# Patient Record
Sex: Male | Born: 1945 | Race: White | Hispanic: No | Marital: Married | Smoking: Former smoker
Health system: Southern US, Community
[De-identification: ages and names within clinical notes are randomized; demographics above are authoritative.]

## PROBLEM LIST (undated history)

## (undated) DIAGNOSIS — I639 Cerebral infarction, unspecified: Secondary | ICD-10-CM

## (undated) HISTORY — DX: Cerebral infarction, unspecified: I63.9

## (undated) HISTORY — PX: ROTATOR CUFF REPAIR: SHX139

## (undated) HISTORY — PX: HERNIA REPAIR: SHX51

---

## 2010-01-20 ENCOUNTER — Emergency Department (HOSPITAL_COMMUNITY): Admission: EM | Admit: 2010-01-20 | Discharge: 2010-01-20 | Payer: Self-pay | Admitting: Emergency Medicine

## 2010-01-25 ENCOUNTER — Encounter (INDEPENDENT_AMBULATORY_CARE_PROVIDER_SITE_OTHER): Payer: Self-pay | Admitting: Neurology

## 2010-01-25 ENCOUNTER — Ambulatory Visit (HOSPITAL_COMMUNITY): Admission: RE | Admit: 2010-01-25 | Discharge: 2010-01-25 | Payer: Self-pay | Admitting: Neurology

## 2010-01-29 ENCOUNTER — Ambulatory Visit (HOSPITAL_COMMUNITY): Admission: RE | Admit: 2010-01-29 | Discharge: 2010-01-29 | Payer: Self-pay | Admitting: Neurology

## 2011-02-06 LAB — LIPID PANEL
Cholesterol: 185 mg/dL (ref 0–200)
LDL Cholesterol: 142 mg/dL — ABNORMAL HIGH (ref 0–99)
Total CHOL/HDL Ratio: 6 RATIO
VLDL: 12 mg/dL (ref 0–40)

## 2011-02-06 LAB — CBC
Hemoglobin: 14.5 g/dL (ref 13.0–17.0)
Platelets: 181 10*3/uL (ref 150–400)

## 2011-02-06 LAB — DIFFERENTIAL
Basophils Absolute: 0 10*3/uL (ref 0.0–0.1)
Basophils Relative: 1 % (ref 0–1)
Eosinophils Absolute: 0 10*3/uL (ref 0.0–0.7)
Eosinophils Relative: 0 % (ref 0–5)
Lymphocytes Relative: 20 % (ref 12–46)
Monocytes Relative: 4 % (ref 3–12)
Neutro Abs: 4.9 10*3/uL (ref 1.7–7.7)

## 2011-02-06 LAB — COMPREHENSIVE METABOLIC PANEL
AST: 22 U/L (ref 0–37)
Alkaline Phosphatase: 33 U/L — ABNORMAL LOW (ref 39–117)
CO2: 25 mEq/L (ref 19–32)
Chloride: 106 mEq/L (ref 96–112)
Creatinine, Ser: 1.09 mg/dL (ref 0.4–1.5)
Potassium: 3.9 mEq/L (ref 3.5–5.1)
Sodium: 137 mEq/L (ref 135–145)
Total Bilirubin: 0.9 mg/dL (ref 0.3–1.2)

## 2014-05-04 ENCOUNTER — Inpatient Hospital Stay (HOSPITAL_COMMUNITY)
Admission: EM | Admit: 2014-05-04 | Discharge: 2014-05-06 | DRG: 069 | Disposition: A | Discharge status: Home / Self Care | Payer: BC Managed Care – PPO | Attending: Internal Medicine | Admitting: Internal Medicine

## 2014-05-04 ENCOUNTER — Encounter (HOSPITAL_COMMUNITY): Payer: Self-pay | Admitting: Emergency Medicine

## 2014-05-04 ENCOUNTER — Emergency Department (HOSPITAL_COMMUNITY): Payer: BC Managed Care – PPO

## 2014-05-04 DIAGNOSIS — R209 Unspecified disturbances of skin sensation: Secondary | ICD-10-CM | POA: Diagnosis present

## 2014-05-04 DIAGNOSIS — R51 Headache: Secondary | ICD-10-CM | POA: Diagnosis present

## 2014-05-04 DIAGNOSIS — G459 Transient cerebral ischemic attack, unspecified: Principal | ICD-10-CM | POA: Diagnosis present

## 2014-05-04 DIAGNOSIS — Z7982 Long term (current) use of aspirin: Secondary | ICD-10-CM

## 2014-05-04 DIAGNOSIS — Z823 Family history of stroke: Secondary | ICD-10-CM

## 2014-05-04 DIAGNOSIS — I6359 Cerebral infarction due to unspecified occlusion or stenosis of other cerebral artery: Secondary | ICD-10-CM

## 2014-05-04 DIAGNOSIS — Z8249 Family history of ischemic heart disease and other diseases of the circulatory system: Secondary | ICD-10-CM

## 2014-05-04 DIAGNOSIS — I639 Cerebral infarction, unspecified: Secondary | ICD-10-CM | POA: Diagnosis present

## 2014-05-04 DIAGNOSIS — Z833 Family history of diabetes mellitus: Secondary | ICD-10-CM

## 2014-05-04 DIAGNOSIS — Z87891 Personal history of nicotine dependence: Secondary | ICD-10-CM | POA: Diagnosis not present

## 2014-05-04 LAB — I-STAT CHEM 8, ED
BUN: 13 mg/dL (ref 6–23)
CHLORIDE: 103 meq/L (ref 96–112)
Calcium, Ion: 1.11 mmol/L — ABNORMAL LOW (ref 1.13–1.30)
Creatinine, Ser: 1 mg/dL (ref 0.50–1.35)
GLUCOSE: 114 mg/dL — AB (ref 70–99)
HEMATOCRIT: 46 % (ref 39.0–52.0)
HEMOGLOBIN: 15.6 g/dL (ref 13.0–17.0)
POTASSIUM: 3.7 meq/L (ref 3.7–5.3)
Sodium: 142 mEq/L (ref 137–147)
TCO2: 24 mmol/L (ref 0–100)

## 2014-05-04 LAB — URINALYSIS, ROUTINE W REFLEX MICROSCOPIC
Bilirubin Urine: NEGATIVE
Glucose, UA: NEGATIVE mg/dL
Hgb urine dipstick: NEGATIVE
Ketones, ur: NEGATIVE mg/dL
Leukocytes, UA: NEGATIVE
NITRITE: NEGATIVE
Protein, ur: NEGATIVE mg/dL
SPECIFIC GRAVITY, URINE: 1.006 (ref 1.005–1.030)
Urobilinogen, UA: 0.2 mg/dL (ref 0.0–1.0)
pH: 7.5 (ref 5.0–8.0)

## 2014-05-04 LAB — PROTIME-INR
INR: 0.98 (ref 0.00–1.49)
Prothrombin Time: 12.8 seconds (ref 11.6–15.2)

## 2014-05-04 LAB — RAPID URINE DRUG SCREEN, HOSP PERFORMED
Amphetamines: NOT DETECTED
BARBITURATES: NOT DETECTED
Benzodiazepines: NOT DETECTED
Cocaine: NOT DETECTED
Opiates: NOT DETECTED
TETRAHYDROCANNABINOL: NOT DETECTED

## 2014-05-04 LAB — COMPREHENSIVE METABOLIC PANEL
ALBUMIN: 3.8 g/dL (ref 3.5–5.2)
ALT: 14 U/L (ref 0–53)
AST: 21 U/L (ref 0–37)
Alkaline Phosphatase: 37 U/L — ABNORMAL LOW (ref 39–117)
BUN: 13 mg/dL (ref 6–23)
CO2: 24 mEq/L (ref 19–32)
Calcium: 8.7 mg/dL (ref 8.4–10.5)
Chloride: 104 mEq/L (ref 96–112)
Creatinine, Ser: 1 mg/dL (ref 0.50–1.35)
GFR calc Af Amer: 87 mL/min — ABNORMAL LOW (ref >90)
GFR calc non Af Amer: 75 mL/min — ABNORMAL LOW (ref >90)
Glucose, Bld: 115 mg/dL — ABNORMAL HIGH (ref 70–99)
POTASSIUM: 3.9 meq/L (ref 3.7–5.3)
Sodium: 141 mEq/L (ref 137–147)
Total Bilirubin: 0.6 mg/dL (ref 0.3–1.2)
Total Protein: 6.9 g/dL (ref 6.0–8.3)

## 2014-05-04 LAB — DIFFERENTIAL
BASOS PCT: 1 % (ref 0–1)
Basophils Absolute: 0 10*3/uL (ref 0.0–0.1)
Eosinophils Absolute: 0 10*3/uL (ref 0.0–0.7)
Eosinophils Relative: 0 % (ref 0–5)
LYMPHS PCT: 21 % (ref 12–46)
Lymphs Abs: 1.1 10*3/uL (ref 0.7–4.0)
MONOS PCT: 6 % (ref 3–12)
Monocytes Absolute: 0.3 10*3/uL (ref 0.1–1.0)
NEUTROS ABS: 3.8 10*3/uL (ref 1.7–7.7)
Neutrophils Relative %: 72 % (ref 43–77)

## 2014-05-04 LAB — CBC
HCT: 43.2 % (ref 39.0–52.0)
HEMOGLOBIN: 14.3 g/dL (ref 13.0–17.0)
MCH: 30.9 pg (ref 26.0–34.0)
MCHC: 33.1 g/dL (ref 30.0–36.0)
MCV: 93.3 fL (ref 78.0–100.0)
Platelets: 177 10*3/uL (ref 150–400)
RBC: 4.63 MIL/uL (ref 4.22–5.81)
RDW: 13.3 % (ref 11.5–15.5)
WBC: 5.3 10*3/uL (ref 4.0–10.5)

## 2014-05-04 LAB — I-STAT TROPONIN, ED: TROPONIN I, POC: 0.01 ng/mL (ref 0.00–0.08)

## 2014-05-04 LAB — ETHANOL: Alcohol, Ethyl (B): <11 mg/dL (ref 0–11)

## 2014-05-04 LAB — APTT: APTT: 30 s (ref 24–37)

## 2014-05-04 MED ORDER — HEPARIN SODIUM (PORCINE) 5000 UNIT/ML IJ SOLN
5000.0000 [IU] | Freq: Three times a day (TID) | INTRAMUSCULAR | Status: DC
  Administered 2014-05-05 – 2014-05-06 (×5): 5000 [IU] via SUBCUTANEOUS
  Filled 2014-05-04 (×4): qty 1

## 2014-05-04 MED ORDER — ASPIRIN 325 MG PO TABS
325.0000 mg | ORAL_TABLET | Freq: Every day | ORAL | Status: DC
  Administered 2014-05-05: 325 mg via ORAL
  Filled 2014-05-04: qty 1

## 2014-05-04 NOTE — ED Notes (Signed)
Pt was given coffee(decaf) and a white bag lunch

## 2014-05-04 NOTE — ED Notes (Signed)
Per EMS: Pt at work when he experienced sudden right sided facial numbness/tingling and expressive aphasia. Coworkers state he didn't have facial droop or slurred speech. Episode lasted appx 10 minutes. Pt now denies any complaints. Neuro intact. Reports similar episode three years ago. 137/84. 76 NSR. 18 RR. 98% RA. CBG 156.

## 2014-05-04 NOTE — H&P (Signed)
[facility] History and Physical  Daniel Harper [id number] DOB: 08/19/1946 DOA: 05/04/2014  Referring physician: EDP PCP: Daniel Harper,Iain, MD   Chief Complaint: R sided numbness / weakness   HPI: Daniel EhrichRobert Harper is a 68 y.o. male who was talking on telephone this morning at work when he developed sudden onset of headache, R sided head and face paresthesia, difficulty with speech, difficulty with swallowing, RUE and RLE numbness and difficulty walking.  Symptoms lasted 25 mins, while completing the call and helping the client.  He then walked to his supervisor to report the problem who then promptly called EMS and patient was transported here.  He does report the call was stressful as client was agitated but patient did not feel stressed.  He was evaluated for TIA 4 years ago after an episode of double vision.  Thankfully by the time of evaluation in the ED he reports feeling 100% better and has no symptoms at present time.  Review of Systems: Systems reviewed.  As above, otherwise negative  History reviewed. No pertinent past medical history. Past Surgical History  Procedure Laterality Date  . Hernia repair     Social History:  reports that he quit smoking about 47 years ago. His smoking use included Cigarettes. He smoked 0.00 packs per day. He does not have any smokeless tobacco history on file. He reports that he does not drink alcohol or use illicit drugs.  No Known Allergies  Family History  Problem Relation Age of Onset  . Stroke Mother   . Hypertension Mother   . Diabetes Mother   . Cancer Mother   . Stroke Father   . Hypertension Father   . Diabetes Father      Prior to Admission medications   Medication Sig Start Date End Date Taking? Authorizing Provider  aspirin EC 81 MG tablet Take 81 mg by mouth daily.   Yes Historical Provider, MD  ibuprofen (ADVIL,MOTRIN) 200 MG tablet Take 200-800 mg by mouth every 6 (six) hours as needed for fever, headache, mild  pain, moderate pain or cramping.   Yes Historical Provider, MD  Multiple Vitamin (MULTIVITAMIN) tablet Take 1 tablet by mouth daily.   Yes Historical Provider, MD   Physical Exam: Filed Vitals:   05/04/14 2130  BP: 127/71  Pulse: 67  Temp:   Resp: 18    BP 127/71  Pulse 67  Temp(Src) 97.6 F (36.4 C)  Resp 18  SpO2 98%  General Appearance:    Alert, oriented, no distress, appears stated age  Head:    Normocephalic, atraumatic  Eyes:    PERRL, EOMI, sclera non-icteric        Nose:   Nares without drainage or epistaxis. Mucosa, turbinates normal  Throat:   Moist mucous membranes. Oropharynx without erythema or exudate.  Neck:   Supple. No carotid bruits.  No thyromegaly.  No lymphadenopathy.   Back:     No CVA tenderness, no spinal tenderness  Lungs:     Clear to auscultation bilaterally, without wheezes, rhonchi or rales  Chest wall:    No tenderness to palpitation  Heart:    Regular rate and rhythm without murmurs, gallops, rubs  Abdomen:     Soft, non-tender, nondistended, normal bowel sounds, no organomegaly  Genitalia:    deferred  Rectal:    deferred  Extremities:   No clubbing, cyanosis or edema.  Pulses:   2+ and symmetric all extremities  Skin:   Skin color, texture, turgor normal, no rashes or lesions  Lymph nodes:   Cervical, supraclavicular, and axillary nodes normal  Neurologic:   CNII-XII intact. Normal strength, sensation and reflexes      throughout    Labs on Admission:  Basic Metabolic Panel:  Recent Labs Lab 05/04/14 1615 05/04/14 1650  NA 141 142  K 3.9 3.7  CL 104 103  CO2 24  --   GLUCOSE 115* 114*  BUN 13 13  CREATININE 1.00 1.00  CALCIUM 8.7  --    Liver Function Tests:  Recent Labs Lab 05/04/14 1615  AST 21  ALT 14  ALKPHOS 37*  BILITOT 0.6  PROT 6.9  ALBUMIN 3.8   No results found for this basename: LIPASE, AMYLASE,  in the last 168 hours No results found for this basename: AMMONIA,  in the last 168 hours CBC:  Recent  Labs Lab 05/04/14 1615 05/04/14 1650  WBC 5.3  --   NEUTROABS 3.8  --   HGB 14.3 15.6  HCT 43.2 46.0  MCV 93.3  --   PLT 177  --    Cardiac Enzymes: No results found for this basename: CKTOTAL, CKMB, CKMBINDEX, TROPONINI,  in the last 168 hours  BNP (last 3 results) No results found for this basename: PROBNP,  in the last 8760 hours CBG: No results found for this basename: GLUCAP,  in the last 168 hours  Radiological Exams on Admission: Ct Head Wo Contrast  05/04/2014   CLINICAL DATA:  Numbness.  Stroke symptoms.  EXAM: CT HEAD WITHOUT CONTRAST  TECHNIQUE: Contiguous axial images were obtained from the base of the skull through the vertex without intravenous contrast.  COMPARISON:  01/29/2010  FINDINGS: Subtle remote peripheral right cerebellar infarct, no change from 2011.  The brainstem, cerebral peduncle is, thalami, basal ganglia, ventricular system, and basilar cisterns appear otherwise unremarkable aside from likely physiologic calcification in the globus pallidus nuclei.  No intracranial hemorrhage, mass lesion, or acute CVA.  IMPRESSION: 1. No acute intracranial findings. 2. Small remote right cerebellar infarct.   Electronically Signed   By: Daniel BaltimoreWalt  Harper M.D.   On: 05/04/2014 18:18    EKG: Independently reviewed.  Assessment/Plan Principal Problem:   TIA (transient ischemic attack)   1. TIA - R sided symptoms highly suspicious for a L MCA territory TIA.  Full Stroke work up has been ordered at this time.  Putting patient on ASA 325 daily.   Code Status: Full  Family Communication: No family in room Disposition Plan: Admit to inpatient   Time spent: 50 min  Harper, Daniel M. [facility] Pager [phone]  If 7AM-7PM, please contact the day team taking care of the patient [email or web address] Password [email or web address] 05/04/2014, 10:06 PM

## 2014-05-04 NOTE — ED Provider Notes (Signed)
CSN: [id number]     Arrival date & time 05/04/14  1332 History   First MD Initiated Contact with Patient 05/04/14 1429     Chief Complaint  Patient presents with  . Numbness  . Stroke Symptoms     (Consider location/radiation/quality/duration/timing/severity/associated sxs/prior Treatment) HPI  Daniel Harper is a 68 y.o. male was talking on telephone, with a client when he had a sudden onset of headache and right sided head and face paresthesia. The numbness extended to his right arm and right leg over 5-10 minutes. He was able to complete the call and help the client. He then walked to his supervisor to report his problem , and he knows was called, and he came here for evaluation. He, states that the call was stressful, because the client was agitated. The patient did not feel stressed. He was evaluated for TIA, when he had blurred and double vision, 4 years ago, and has had several additional episodes, over the last 2 years. He is able to exercise regularly. He denies hypertension, hypercholesterolemia, previous stroke or cardiac problems. He feels 100% better, from the incident today. Total time of discomfort this morning was 25 minutes. He denies recent fever, chills, sinus pain, chest pain, cough, shortness of breath, abdominal pain, bowel or urinary changes. He's taking his usual medications, without relief. There are no other known modifying factors.   History reviewed. No pertinent past medical history. Past Surgical History  Procedure Laterality Date  . Hernia repair     Family History  Problem Relation Age of Onset  . Stroke Mother   . Hypertension Mother   . Diabetes Mother   . Cancer Mother   . Stroke Father   . Hypertension Father   . Diabetes Father    History  Substance Use Topics  . Smoking status: Former Smoker    Types: Cigarettes    Quit date: 05/05/1967  . Smokeless tobacco: Not on file  . Alcohol Use: No    Review of Systems  All other systems  reviewed and are negative.     Allergies  Review of patient's allergies indicates no known allergies.  Home Medications   Prior to Admission medications   Medication Sig Start Date End Date Taking? Authorizing Alontae Chaloux  aspirin EC 81 MG tablet Take 81 mg by mouth daily.   Yes Historical Jaymison Luber, MD  ibuprofen (ADVIL,MOTRIN) 200 MG tablet Take 200-800 mg by mouth every 6 (six) hours as needed for fever, headache, mild pain, moderate pain or cramping.   Yes Historical Victoriya Pol, MD  Multiple Vitamin (MULTIVITAMIN) tablet Take 1 tablet by mouth daily.   Yes Historical Walaa Carel, MD   BP 126/89  Pulse 65  Temp(Src) 99 F (37.2 C) (Oral)  Resp 18  Ht 6\' 3"  (1.905 m)  Wt 217 lb (98.431 kg)  BMI 27.12 kg/m2  SpO2 100% Physical Exam  Nursing note and vitals reviewed. Constitutional: He is oriented to person, place, and time. He appears well-developed and well-nourished.  HENT:  Head: Normocephalic and atraumatic.  Right Ear: External ear normal.  Left Ear: External ear normal.  Eyes: Conjunctivae and EOM are normal. Pupils are equal, round, and reactive to light.  Neck: Normal range of motion and phonation normal. Neck supple.  Cardiovascular: Normal rate, regular rhythm, normal heart sounds and intact distal pulses.   Pulmonary/Chest: Effort normal and breath sounds normal. He exhibits no bony tenderness.  Abdominal: Soft. There is no tenderness.  Musculoskeletal: Normal range of motion.  Neurological: He  is alert and oriented to person, place, and time. No cranial nerve deficit or sensory deficit. He exhibits normal muscle tone. Coordination normal.  No dysarthria, aphasia or nystagmus. Normal gait. Negative Romberg.  Skin: Skin is warm, dry and intact.  Psychiatric: He has a normal mood and affect. His behavior is normal. Judgment and thought content normal.    ED Course  Procedures (including critical care time)  Medications  heparin injection 5,000 Units (5,000 Units  Subcutaneous Given 05/05/14 0740)  aspirin tablet 325 mg (not administered)    Patient Vitals for the past 24 hrs:  BP Temp Temp src Pulse Resp SpO2 Height Weight  05/05/14 0800 126/89 mmHg 99 F (37.2 C) Oral 65 18 100 % - -  05/05/14 0600 131/82 mmHg 97.9 F (36.6 C) Oral 64 18 98 % - -  05/05/14 0400 129/86 mmHg - - - - - - -  05/05/14 0200 136/80 mmHg - - - - - - -  05/05/14 0000 135/70 mmHg 98.6 F (37 C) Oral 57 18 100 % - -  05/04/14 2221 136/82 mmHg 98.4 F (36.9 C) Oral 67 18 99 % 6\' 3"  (1.905 m) 217 lb (98.431 kg)  05/04/14 2130 127/71 mmHg - - 67 18 98 % - -  05/04/14 2100 132/77 mmHg - - 68 17 100 % - -  05/04/14 2030 131/66 mmHg - - 73 17 99 % - -  05/04/14 2000 142/70 mmHg - - 68 17 98 % - -  05/04/14 1930 149/81 mmHg - - 70 14 100 % - -  05/04/14 1900 140/70 mmHg - - 69 16 100 % - -  05/04/14 1833 - - - 69 14 99 % - -  05/04/14 1832 144/78 mmHg - - - - - - -  05/04/14 1800 115/82 mmHg - - 69 19 99 % - -  05/04/14 1730 141/83 mmHg - - 62 13 100 % - -  05/04/14 1725 148/82 mmHg - - 62 12 99 % - -  05/04/14 1700 148/82 mmHg - - 74 17 99 % - -  05/04/14 1620 - 97.6 F (36.4 C) - - - - - -  05/04/14 1600 121/97 mmHg - - 57 15 99 % - -  05/04/14 1500 148/92 mmHg - - 72 21 100 % - -  05/04/14 1430 145/79 mmHg - - 60 18 100 % - -  05/04/14 1340 154/79 mmHg - - 57 18 100 % - -   15:25- he was offered a comprehensive evaluation for TIA. He wanted to talk to an insurance representative, before agreeing to proceed with that.  16:35- he now wants  to proceed with evaluation in the ED. Stroke panel ordered.  4:38 PM Reevaluation with update and discussion. After initial assessment and treatment, an updated evaluation reveals PE unchanged. WENTZ,ELLIOTT L      Labs Review Labs Reviewed  COMPREHENSIVE METABOLIC PANEL - Abnormal; Notable for the following:    Glucose, Bld 115 (*)    Alkaline Phosphatase 37 (*)    GFR calc non Af Amer 75 (*)    GFR calc Af Amer 87 (*)     All other components within normal limits  LIPID PANEL - Abnormal; Notable for the following:    HDL 34 (*)    LDL Cholesterol 110 (*)    All other components within normal limits  I-STAT CHEM 8, ED - Abnormal; Notable for the following:    Glucose, Bld 114 (*)  Calcium, Ion 1.11 (*)    All other components within normal limits  ETHANOL  PROTIME-INR  APTT  CBC  DIFFERENTIAL  URINE RAPID DRUG SCREEN (HOSP PERFORMED)  URINALYSIS, ROUTINE W REFLEX MICROSCOPIC  HEMOGLOBIN A1C  I-STAT TROPOININ, ED  I-STAT TROPOININ, ED    Imaging Review Ct Head Wo Contrast  05/04/2014   CLINICAL DATA:  Numbness.  Stroke symptoms.  EXAM: CT HEAD WITHOUT CONTRAST  TECHNIQUE: Contiguous axial images were obtained from the base of the skull through the vertex without intravenous contrast.  COMPARISON:  01/29/2010  FINDINGS: Subtle remote peripheral right cerebellar infarct, no change from 2011.  The brainstem, cerebral peduncle is, thalami, basal ganglia, ventricular system, and basilar cisterns appear otherwise unremarkable aside from likely physiologic calcification in the globus pallidus nuclei.  No intracranial hemorrhage, mass lesion, or acute CVA.  IMPRESSION: 1. No acute intracranial findings. 2. Small remote right cerebellar infarct.   Electronically Signed   By: Herbie BaltimoreWalt  Liebkemann M.D.   On: 05/04/2014 18:18     EKG Interpretation   Date/Time:  Monday May 04 2014 16:40:52 EDT Ventricular Rate:  66 PR Interval:  140 QRS Duration: 90 QT Interval:  417 QTC Calculation: 437 R Axis:   38 Text Interpretation:  Sinus rhythm Probable left atrial enlargement since  last tracing no significant change Confirmed by Effie ShyWENTZ  MD, ELLIOTT ([id number]  on 05/04/2014 4:45:56 PM      MDM   Final diagnoses:  Transient cerebral ischemia, unspecified transient cerebral ischemia type    Apparent TIA. Will need admission for further testing and treatment.  Nursing Notes Reviewed/ Care Coordinated Applicable  Imaging Reviewed Interpretation of Laboratory Data incorporated into ED treatment   Plan: Admit    Flint MelterElliott L Wentz, MD 05/05/14 [phone]

## 2014-05-04 NOTE — ED Provider Notes (Signed)
CT head nml, Pt will be admitted to [facility] for TIA w/u.   1. Transient cerebral ischemia, unspecified transient cerebral ischemia type      Shanna CiscoMegan E Docherty, MD 05/04/14 2114

## 2014-05-04 NOTE — ED Notes (Signed)
Pt states he was at work when he had sudden onset of numbness/tingling to right side of face that extended down his right arm and into right leg. States "I could think clearly but I had a really hard time getting my words out." Per coworkers, no facial droop noted or slurred speech. States that episode lasted 20 minutes.

## 2014-05-04 NOTE — ED Notes (Signed)
Md Wentz at bedside.  

## 2014-05-04 NOTE — ED Notes (Signed)
Pt states that NOW he feels fine and he is back to a normal feeling again. He states that he is in NO pain  And NO numbness now.

## 2014-05-05 ENCOUNTER — Inpatient Hospital Stay (HOSPITAL_COMMUNITY): Payer: BC Managed Care – PPO

## 2014-05-05 DIAGNOSIS — G459 Transient cerebral ischemic attack, unspecified: Secondary | ICD-10-CM

## 2014-05-05 DIAGNOSIS — I369 Nonrheumatic tricuspid valve disorder, unspecified: Secondary | ICD-10-CM

## 2014-05-05 LAB — LIPID PANEL
CHOL/HDL RATIO: 4.6 ratio
Cholesterol: 156 mg/dL (ref 0–200)
HDL: 34 mg/dL — AB (ref >39)
LDL Cholesterol: 110 mg/dL — ABNORMAL HIGH (ref 0–99)
Triglycerides: 60 mg/dL (ref <150)
VLDL: 12 mg/dL (ref 0–40)

## 2014-05-05 LAB — GLUCOSE, CAPILLARY: Glucose-Capillary: 190 mg/dL — ABNORMAL HIGH (ref 70–99)

## 2014-05-05 LAB — HEMOGLOBIN A1C
Hgb A1c MFr Bld: 5.9 % — ABNORMAL HIGH (ref <5.7)
MEAN PLASMA GLUCOSE: 123 mg/dL — AB (ref <117)

## 2014-05-05 MED ORDER — ATORVASTATIN CALCIUM 10 MG PO TABS
10.0000 mg | ORAL_TABLET | Freq: Every day | ORAL | Status: DC
  Administered 2014-05-05: 10 mg via ORAL
  Filled 2014-05-05: qty 1

## 2014-05-05 NOTE — Evaluation (Signed)
Physical Therapy Evaluation and Discharge Patient Details Name: Daniel Harper MRN: [id number] DOB: 07/28/1946 Today's Date: 05/05/2014   History of Present Illness  Daniel Harper is a 68 y.o. male who was talking on telephone 6/22 this morning at work when he developed sudden onset of headache, R sided head and face paresthesia, difficulty with speech, difficulty with swallowing, RUE and RLE numbness and difficulty walking.  Symptoms lasted 25 mins, while completing the call and helping the client.  He then walked to his supervisor to report the problem who then promptly called EMS and patient was transported here.  Clinical Impression  Pt functioning at baseline with c/o cervical stiffness most likely due to having "slept wrong" per pt report. Pt ambulating in hallways independently. Pt scored 23/24 on DGI indicating pt at minimal falls risk. Pt with no further acute PT needs at this time. PT signing off, please re-consult if needed in future.    Follow Up Recommendations No PT follow up    Equipment Recommendations  None recommended by PT    Recommendations for Other Services       Precautions / Restrictions Precautions Precautions: None Restrictions Weight Bearing Restrictions: No      Mobility  Bed Mobility Overal bed mobility: Independent             General bed mobility comments: safe technique  Transfers Overall transfer level: Independent Equipment used: None             General transfer comment: safe technique  Ambulation/Gait Ambulation/Gait assistance: Independent Ambulation Distance (Feet): 600 Feet Assistive device: None Gait Pattern/deviations: WFL(Within Functional Limits) Gait velocity: wfl   General Gait Details: pt observed amb in hallway prior to eval and then completed additional lap with PT. pt with no episodes of dizziness or LOB  Stairs Stairs: Yes Stairs assistance: Modified independent (Device/Increase time) Stair  Management: One rail Right Number of Stairs: 12 General stair comments: alternating pattern   Wheelchair Mobility    Modified Rankin (Stroke Patients Only)       Balance Overall balance assessment: Independent               Single Leg Stance - Right Leg: 1 (pt with increased R ankle strategy to maintain balance) Single Leg Stance - Left Leg: 1             Standardized Balance Assessment Standardized Balance Assessment : Dynamic Gait Index   Dynamic Gait Index Level Surface: Normal Change in Gait Speed: Normal Gait with Horizontal Head Turns: Normal Gait with Vertical Head Turns: Normal Gait and Pivot Turn: Normal Step Over Obstacle: Normal Step Around Obstacles: Normal Steps: Mild Impairment Total Score: 23       Pertinent Vitals/Pain Denies pain    Home Living Family/patient expects to be discharged to:: Private residence Living Arrangements: Spouse/significant other Available Help at Discharge: Family;Available 24 hours/day Type of Home: House Home Access: Stairs to enter Entrance Stairs-Rails: Right Entrance Stairs-Number of Steps: 6 Home Layout: Able to live on main level with bedroom/bathroom;Laundry or work area in [employer] Insurancebasement Home Equipment: None      Prior Function Level of Independence: Independent         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     Hand Dominance   Dominant Hand: Right    Extremity/Trunk Assessment   Upper Extremity Assessment: Overall WFL for tasks assessed           Lower Extremity Assessment: Overall WFL for tasks assessed  Cervical / Trunk Assessment:  (c/o stiffness from sleeping wrong)  Communication   Communication: No difficulties  Cognition Arousal/Alertness: Awake/alert Behavior During Therapy: WFL for tasks assessed/performed Overall Cognitive Status: Within Functional Limits for tasks assessed                      General Comments      Exercises Other Exercises Other Exercises:  cervical neck stretches to assist wtih stiffness      Assessment/Plan    PT Assessment Patent does not need any further PT services  PT Diagnosis     PT Problem List    PT Treatment Interventions     PT Goals (Current goals can be found in the Care Plan section) Acute Rehab PT Goals Patient Stated Goal: to figure out why this happened PT Goal Formulation: No goals set, d/c therapy    Frequency     Barriers to discharge        Co-evaluation               End of Session Equipment Utilized During Treatment: Gait belt Activity Tolerance: Patient tolerated treatment well Patient left: in bed;with call bell/phone within reach Nurse Communication: Mobility status         Time: 1335-1401 PT Time Calculation (min): 26 min   Charges:   PT Evaluation $Initial PT Evaluation Tier I: 1 Procedure PT Treatments $Gait Training: 8-22 mins   PT G CodesMarcene Brawn:          Chadwell, Ashly Marie 05/05/2014, 2:55 PM  Lewis ShockAshly Chadwell, PT, DPT Pager #: [phone] Office #: [phone]

## 2014-05-05 NOTE — Progress Notes (Signed)
Patient Demographics  Daniel EhrichRobert Harper, is a 68 y.o. male, DOB - 03/05/1946, ZOX:[id number]  Admit date - 05/04/2014   Admitting Physician Hillary BowJared M Gardner, DO  Outpatient Primary MD for the patient is [name],Ryann, MD  LOS - 1   Chief Complaint  Patient presents with  . Numbness  . Stroke Symptoms           Subjective:   Daniel EhrichRobert Toso today has, No headache, No chest pain, No abdominal pain - No Nausea, No new weakness tingling or numbness, No Cough - SOB.      Assessment & Plan    1. TIA - right-sided tingling numbness and weakness, symptoms completely resolve, head CT unremarkable, this likely was TIA, full stroke workup underway which will include monitoring on on tele, obtain PT, OT, speech input, A1c and lipid panel, check MRI MRA brain, echogram, carotid duplex, currently on full dose aspirin from baby aspirin for secondary prevention, since LDL was greater than 100 low-dose statin started.   Lab Results  Component Value Date   HGBA1C  Value: 6.0 (NOTE) The ADA recommends the following therapeutic goal for glycemic control related to Hgb A1c measurement: Goal of therapy: <6.5 Hgb A1c  Reference: American Diabetes Association: Clinical Practice Recommendations 2010, Diabetes Care, 2010, 33: (Suppl  1). 01/20/2010    Lab Results  Component Value Date   CHOL 156 05/05/2014   HDL 34* 05/05/2014   LDLCALC 110* 05/05/2014   TRIG 60 05/05/2014   CHOLHDL 4.6 05/05/2014       Code Status: Full  Family Communication:    Disposition Plan: Home   Procedures CT head, MRI/MRA brain, echo, carotid duplex.   Consults      Medications  Scheduled Meds: . aspirin  325 mg Oral Daily  . atorvastatin  10 mg Oral q1800  . heparin  5,000 Units Subcutaneous 3 times per day    Continuous Infusions:  PRN Meds:.  DVT Prophylaxis   Heparin    Lab Results  Component Value Date   PLT 177 05/04/2014    Antibiotics     Anti-infectives   None          Objective:   Filed Vitals:   05/05/14 0400 05/05/14 0600 05/05/14 0800 05/05/14 1000  BP: 129/86 131/82 126/89 138/90  Pulse:  64 65 66  Temp:  97.9 F (36.6 C) 99 F (37.2 C) 97.7 F (36.5 C)  TempSrc:  Oral Oral Oral  Resp:  18 18 18   Height:      Weight:      SpO2:  98% 100% 100%    Wt Readings from Last 3 Encounters:  05/04/14 98.431 kg (217 lb)     Intake/Output Summary (Last 24 hours) at 05/05/14 1308 Last data filed at 05/05/14 0830  Gross per 24 hour  Intake    360 ml  Output      0 ml  Net    360 ml     Physical Exam  Awake Alert, Oriented X 3, No new F.N deficits, Normal affect [place].AT,PERRAL Supple Neck,No JVD, No cervical lymphadenopathy appriciated.  Symmetrical Chest wall movement, Good air movement bilaterally, CTAB RRR,No Gallops,Rubs or new Murmurs, No Parasternal Heave +ve B.Sounds, Abd Soft, No tenderness, No  organomegaly appriciated, No rebound - guarding or rigidity. No Cyanosis, Clubbing or edema, No new Rash or bruise     Data Review   Micro Results No results found for this or any previous visit (from the past 240 hour(s)).  Radiology Reports Ct Head Wo Contrast  05/04/2014   CLINICAL DATA:  Numbness.  Stroke symptoms.  EXAM: CT HEAD WITHOUT CONTRAST  TECHNIQUE: Contiguous axial images were obtained from the base of the skull through the vertex without intravenous contrast.  COMPARISON:  01/29/2010  FINDINGS: Subtle remote peripheral right cerebellar infarct, no change from 2011.  The brainstem, cerebral peduncle is, thalami, basal ganglia, ventricular system, and basilar cisterns appear otherwise unremarkable aside from likely physiologic calcification in the globus pallidus nuclei.  No intracranial hemorrhage, mass lesion, or acute CVA.  IMPRESSION: 1. No  acute intracranial findings. 2. Small remote right cerebellar infarct.   Electronically Signed   By: Herbie BaltimoreWalt  Liebkemann M.D.   On: 05/04/2014 18:18    CBC  Recent Labs Lab 05/04/14 1615 05/04/14 1650  WBC 5.3  --   HGB 14.3 15.6  HCT 43.2 46.0  PLT 177  --   MCV 93.3  --   MCH 30.9  --   MCHC 33.1  --   RDW 13.3  --   LYMPHSABS 1.1  --   MONOABS 0.3  --   EOSABS 0.0  --   BASOSABS 0.0  --     Chemistries   Recent Labs Lab 05/04/14 1615 05/04/14 1650  NA 141 142  K 3.9 3.7  CL 104 103  CO2 24  --   GLUCOSE 115* 114*  BUN 13 13  CREATININE 1.00 1.00  CALCIUM 8.7  --   AST 21  --   ALT 14  --   ALKPHOS 37*  --   BILITOT 0.6  --    ------------------------------------------------------------------------------------------------------------------ estimated creatinine clearance is 84.5 ml/min (by C-G formula based on Cr of 1). ------------------------------------------------------------------------------------------------------------------ No results found for this basename: HGBA1C,  in the last 72 hours ------------------------------------------------------------------------------------------------------------------  Recent Labs  05/05/14 0436  CHOL 156  HDL 34*  LDLCALC 110*  TRIG 60  CHOLHDL 4.6   ------------------------------------------------------------------------------------------------------------------ No results found for this basename: TSH, T4TOTAL, FREET3, T3FREE, THYROIDAB,  in the last 72 hours ------------------------------------------------------------------------------------------------------------------ No results found for this basename: VITAMINB12, FOLATE, FERRITIN, TIBC, IRON, RETICCTPCT,  in the last 72 hours  Coagulation profile  Recent Labs Lab 05/04/14 1615  INR 0.98    No results found for this basename: DDIMER,  in the last 72 hours  Cardiac Enzymes No results found for this basename: CK, CKMB, TROPONINI, MYOGLOBIN,  in the  last 168 hours ------------------------------------------------------------------------------------------------------------------ No components found with this basename: POCBNP,      Time Spent in minutes  35   SINGH,PRASHANT K M.D on 05/05/2014 at 1:08 PM  Between 7am to 7pm - Pager - [phone]  After 7pm go to [email or web address] - password [email or web address]  And look for the night coverage person covering for me after hours  [facility] Office  [phone]   **Disclaimer: This note may have been dictated with voice recognition software. Similar sounding words can inadvertently be transcribed and this note may contain transcription errors which may not have been corrected upon publication of note.** ]

## 2014-05-05 NOTE — Progress Notes (Signed)
Echo Lab  2D Echocardiogram completed.  Melissa L Morford, RDCS 05/05/2014 11:22 AM

## 2014-05-05 NOTE — Progress Notes (Signed)
Bilateral carotid artery duplex:  1-39% ICA stenosis.  Vertebral artery flow is antegrade.     

## 2014-05-05 NOTE — Evaluation (Signed)
Speech Language Pathology Evaluation Patient Details Name: Daniel Harper MRN: [id number] DOB: 02/25/1946 Today's Date: 05/05/2014 Time: [id number] SLP Time Calculation (min): 30 min  Problem List:  Patient Active Problem List   Diagnosis Date Noted  . TIA (transient ischemic attack) 05/04/2014   Past Medical History: History reviewed. No pertinent past medical history. Past Surgical History:  Past Surgical History  Procedure Laterality Date  . Hernia repair     HPI:  68 yo male adm [facility] with transient episode of dysarthria and right sided numbness.  CT negative for acute change,small remote cerebellar CVA - ? left MCA TIA.  Pt reports to be at baseline currently.    Assessment / Plan / Recommendation Clinical Impression  Pt presents with functional cognitive linguistic skills without dysarthria or aphasia.  He did have minimal labial assymmetry on left but otherwise CN exam unremarkable.  Mr [name] was able to follow multiple step commands, name 25 animals in 60 seconds and carry on high level conversation re: attention difficulties in lengthy work meetings involving many employees.  SLP educated pt exercises to improve divided attention tasks and to compensate for difficulties during meetings.    Pt questioned if fatigue/stress could be source or cause exacerbation of symptoms- requested he speak to md re: these concerns.     No further SLP warranted as pt is back to baseline/functional and all education completed.      SLP Assessment  Patient does not need any further Speech Lanaguage Pathology Services    Follow Up Recommendations  None    Frequency and Duration     n/a   Pertinent Vitals/Pain Afebrile, decreased    SLP Evaluation Prior Functioning  Cognitive/Linguistic Baseline: Within functional limits  Lives With: Spouse Education: Bachelor's degree in liberal arts, multiple business associated certificates    Cognition  Overall Cognitive Status: Within  Functional Limits for tasks assessed Arousal/Alertness: Awake/alert Orientation Level: Oriented X4 Attention: Sustained;Selective Sustained Attention: Appears intact Selective Attention: Appears intact Memory: Appears intact Awareness: Appears intact (aware of attention difficulties in lengthy work meetings involving many employees) Problem Solving: Appears intact (use of writing key words/phrases to compensate) Safety/Judgment: Appears intact    Comprehension  Auditory Comprehension Overall Auditory Comprehension: Appears within functional limits for tasks assessed Yes/No Questions: Not tested Commands: Within Functional Limits Conversation: Complex Visual Recognition/Discrimination Discrimination: Not tested Reading Comprehension Reading Status: Within funtional limits    Expression Expression Primary Mode of Expression: Verbal Verbal Expression Overall Verbal Expression: Appears within functional limits for tasks assessed Initiation: No impairment Repetition:  (dnt) Naming: Not tested Pragmatics: No impairment Non-Verbal Means of Communication: Not applicable Written Expression Written Expression: Not tested   Oral / Motor Oral Motor/Sensory Function Overall Oral Motor/Sensory Function:  (? minimal asymmetry left labial, but not significant, otherwise WFL) Motor Speech Overall Motor Speech: Appears within functional limits for tasks assessed Respiration: Within functional limits Resonance: Within functional limits Articulation: Within functional limitis Intelligibility: Intelligible Motor Planning: Witnin functional limits Motor Speech Errors: Not applicable   GO     [name], MS [facility] SLP [phone]

## 2014-05-06 DIAGNOSIS — I639 Cerebral infarction, unspecified: Secondary | ICD-10-CM | POA: Diagnosis present

## 2014-05-06 DIAGNOSIS — I6350 Cerebral infarction due to unspecified occlusion or stenosis of unspecified cerebral artery: Secondary | ICD-10-CM

## 2014-05-06 MED ORDER — ATORVASTATIN CALCIUM 10 MG PO TABS: 10.0000 mg | ORAL_TABLET | Freq: Every day | ORAL | Status: AC

## 2014-05-06 MED ORDER — CLOPIDOGREL BISULFATE 75 MG PO TABS
75.0000 mg | ORAL_TABLET | Freq: Every day | ORAL | Status: DC
  Administered 2014-05-06: 75 mg via ORAL
  Filled 2014-05-06: qty 1

## 2014-05-06 MED ORDER — CLOPIDOGREL BISULFATE 75 MG PO TABS: 75.0000 mg | ORAL_TABLET | Freq: Every day | ORAL | Status: AC

## 2014-05-06 NOTE — Discharge Instructions (Signed)
Follow with Primary MD [name],[name], MD in 7 days   Get CBC, CMP checked  by Primary MD next visit.    Activity: As tolerated with Full fall precautions use walker/cane & assistance as needed   Disposition Home     Diet: Heart Healthy  .  For Heart failure patients - Check your Weight same time everyday, if you gain over 2 pounds, or you develop in leg swelling, experience more shortness of breath or chest pain, call your Primary MD immediately. Follow Cardiac Low Salt Diet and 1.8 lit/day fluid restriction.   On your next visit with her primary care physician please Get Medicines reviewed and adjusted.  Please request your Prim.MD to go over all Hospital Tests and Procedure/Radiological results at the follow up, please get all Hospital records sent to your Prim MD by signing hospital release before you go home.   If you experience worsening of your admission symptoms, develop shortness of breath, life threatening emergency, suicidal or homicidal thoughts you must seek medical attention immediately by calling 911 or calling your MD immediately  if symptoms less severe.  You Must read complete instructions/literature along with all the possible adverse reactions/side effects for all the Medicines you take and that have been prescribed to you. Take any new Medicines after you have completely understood and accpet all the possible adverse reactions/side effects.   Do not drive and provide baby sitting services if your were admitted for syncope or siezures until you have seen by Primary MD or a Neurologist and advised to do so again.  Do not drive when taking Pain medications.    Do not take more than prescribed Pain, Sleep and Anxiety Medications  Special Instructions: If you have smoked or chewed Tobacco  in the last 2 yrs please stop smoking, stop any regular Alcohol  and or any Recreational drug use.  Wear Seat belts while driving.   Please note  You were cared for by a  hospitalist during your hospital stay. If you have any questions about your discharge medications or the care you received while you were in the hospital after you are discharged, you can call the unit and asked to speak with the hospitalist on call if the hospitalist that took care of you is not available. Once you are discharged, your primary care physician will handle any further medical issues. Please note that NO REFILLS for any discharge medications will be authorized once you are discharged, as it is imperative that you return to your primary care physician (or establish a relationship with a primary care physician if you do not have one) for your aftercare needs so that they can reassess your need for medications and monitor your lab values.                                                       Daniel Harper was admitted to the Hospital on 05/04/2014 and Discharged  05/06/2014 and should be excused from work/school   for 5  days starting 05/04/2014 , may return to work/school without any restrictions.  Call Daniel RaringPrashant Johnta Couts MD, [facility] [phone] with questions.  Daniel Harper,Daniel Harper M.D on 05/06/2014,at 10:11 AM  [facility]   Office  [phone]

## 2014-05-06 NOTE — Consult Note (Signed)
Referring Physician: Thedore MinsSingh    Chief Complaint: TIA  HPI:                                                                                                                                         Deedra EhrichRobert Val is an 68 y.o. male who was talking on telephone at work when he developed sudden onset of headache, R sided head and face paresthesia, difficulty with speech, difficulty with swallowing, RUE and RLE numbness and difficulty walking. Symptoms lasted 25 mins. He then walked to his supervisor to report the problem who then promptly called EMS and patient was transported to [facility]. On arrial to ED he was back to his baseline. He has no complaints at this time.     Date last known well: Date: 05/04/2014 Time last known well: Unable to determine tPA Given: No: out of window  History reviewed. No pertinent past medical history.  Past Surgical History  Procedure Laterality Date  . Hernia repair      Family History  Problem Relation Age of Onset  . Stroke Mother   . Hypertension Mother   . Diabetes Mother   . Cancer Mother   . Stroke Father   . Hypertension Father   . Diabetes Father    Social History:  reports that he quit smoking about 47 years ago. His smoking use included Cigarettes. He smoked 0.00 packs per day. He does not have any smokeless tobacco history on file. He reports that he does not drink alcohol or use illicit drugs.  Allergies: No Known Allergies  Medications:                                                                                                                           Prior to Admission:  Prescriptions prior to admission  Medication Sig Dispense Refill  . aspirin EC 81 MG tablet Take 81 mg by mouth daily.      Marland Kitchen. ibuprofen (ADVIL,MOTRIN) 200 MG tablet Take 200-800 mg by mouth every 6 (six) hours as needed for fever, headache, mild pain, moderate pain or cramping.      . Multiple Vitamin (MULTIVITAMIN) tablet Take 1 tablet by mouth daily.        Scheduled: . atorvastatin  10 mg Oral q1800  . clopidogrel  75 mg Oral Q breakfast  . heparin  5,000 Units  Subcutaneous 3 times per day    ROS:                                                                                                                                       History obtained from the patient  General ROS: negative for - chills, fatigue, fever, night sweats, weight gain or weight loss Psychological ROS: negative for - behavioral disorder, hallucinations, memory difficulties, mood swings or suicidal ideation Ophthalmic ROS: negative for - blurry vision, double vision, eye pain or loss of vision ENT ROS: negative for - epistaxis, nasal discharge, oral lesions, sore throat, tinnitus or vertigo Allergy and Immunology ROS: negative for - hives or itchy/watery eyes Hematological and Lymphatic ROS: negative for - bleeding problems, bruising or swollen lymph nodes Endocrine ROS: negative for - galactorrhea, hair pattern changes, polydipsia/polyuria or temperature intolerance Respiratory ROS: negative for - cough, hemoptysis, shortness of breath or wheezing Cardiovascular ROS: negative for - chest pain, dyspnea on exertion, edema or irregular heartbeat Gastrointestinal ROS: negative for - abdominal pain, diarrhea, hematemesis, nausea/vomiting or stool incontinence Genito-Urinary ROS: negative for - dysuria, hematuria, incontinence or urinary frequency/urgency Musculoskeletal ROS: negative for - joint swelling or muscular weakness Neurological ROS: as noted in HPI Dermatological ROS: negative for rash and skin lesion changes  Neurologic Examination:                                                                                                      Blood pressure 127/70, pulse 55, temperature 98.4 F (36.9 C), temperature source Oral, resp. rate 18, height 6\' 3"  (1.905 m), weight 98.431 kg (217 lb), SpO2 98.00%.   Mental Status: Alert, oriented, thought content appropriate.   Speech fluent without evidence of aphasia.  Able to follow 3 step commands without difficulty. Cranial Nerves: II: Discs flat bilaterally; Visual fields grossly normal, pupils equal, round, reactive to light and accommodation III,IV, VI: ptosis not present, extra-ocular motions intact bilaterally V,VII: smile symmetric--at rest he has a decrease in his left NLF, facial light touch sensation normal bilaterally VIII: hearing normal bilaterally IX,X: gag reflex present XI: bilateral shoulder shrug XII: midline tongue extension without atrophy or fasciculations  Motor: Right : Upper extremity   5/5    Left:     Upper extremity   5/5  Lower extremity   5/5     Lower extremity   5/5 Tone and bulk:normal tone throughout; no atrophy noted Sensory: Pinprick and light touch intact throughout, bilaterally Deep Tendon  Reflexes:  Right: Upper Extremity   Left: Upper extremity   biceps (C-5 to C-6) 2/4   biceps (C-5 to C-6) 2/4 tricep (C7) 2/4    triceps (C7) 2/4 Brachioradialis (C6) 2/4  Brachioradialis (C6) 2/4  Lower Extremity Lower Extremity  quadriceps (L-2 to L-4) 2/4   quadriceps (L-2 to L-4) 2/4 Achilles (S1) 1/4   Achilles (S1) 1/4  Plantars: Right: downgoing   Left: downgoing Cerebellar: normal finger-to-nose,  normal heel-to-shin test Gait: normal CV: pulses palpable throughout    Lab Results: Basic Metabolic Panel:  Recent Labs Lab 05/04/14 1615 05/04/14 1650  NA 141 142  K 3.9 3.7  CL 104 103  CO2 24  --   GLUCOSE 115* 114*  BUN 13 13  CREATININE 1.00 1.00  CALCIUM 8.7  --     Liver Function Tests:  Recent Labs Lab 05/04/14 1615  AST 21  ALT 14  ALKPHOS 37*  BILITOT 0.6  PROT 6.9  ALBUMIN 3.8   No results found for this basename: LIPASE, AMYLASE,  in the last 168 hours No results found for this basename: AMMONIA,  in the last 168 hours  CBC:  Recent Labs Lab 05/04/14 1615 05/04/14 1650  WBC 5.3  --   NEUTROABS 3.8  --   HGB 14.3 15.6  HCT 43.2  46.0  MCV 93.3  --   PLT 177  --     Cardiac Enzymes: No results found for this basename: CKTOTAL, CKMB, CKMBINDEX, TROPONINI,  in the last 168 hours  Lipid Panel:  Recent Labs Lab 05/05/14 0436  CHOL 156  TRIG 60  HDL 34*  CHOLHDL 4.6  VLDL 12  LDLCALC 086110*    CBG:  Recent Labs Lab 05/05/14 2214  GLUCAP 190*    Microbiology: No results found for this or any previous visit.  Coagulation Studies:  Recent Labs  05/04/14 1615  LABPROT 12.8  INR 0.98    Imaging: Ct Head Wo Contrast  05/04/2014   CLINICAL DATA:  Numbness.  Stroke symptoms.  EXAM: CT HEAD WITHOUT CONTRAST  TECHNIQUE: Contiguous axial images were obtained from the base of the skull through the vertex without intravenous contrast.  COMPARISON:  01/29/2010  FINDINGS: Subtle remote peripheral right cerebellar infarct, no change from 2011.  The brainstem, cerebral peduncle is, thalami, basal ganglia, ventricular system, and basilar cisterns appear otherwise unremarkable aside from likely physiologic calcification in the globus pallidus nuclei.  No intracranial hemorrhage, mass lesion, or acute CVA.  IMPRESSION: 1. No acute intracranial findings. 2. Small remote right cerebellar infarct.   Electronically Signed   By: Herbie BaltimoreWalt  Liebkemann M.D.   On: 05/04/2014 18:18   Mr Brain Wo Contrast  05/05/2014   CLINICAL DATA:  Sudden onset of headache, right-sided head and face paresthesia, speech difficulty, swallowing difficulty, right upper and right lower extremity numbness and difficulty walking. Stroke.  EXAM: MRI HEAD WITHOUT CONTRAST  MRA HEAD WITHOUT CONTRAST  TECHNIQUE: Multiplanar, multiecho pulse sequences of the brain and surrounding structures were obtained without intravenous contrast. Angiographic images of the head were obtained using MRA technique without contrast.  COMPARISON:  Head CT 05/04/2014.  Head MRI/ MRA 01/29/2010.  FINDINGS: MRI HEAD FINDINGS  There is 6 mm of cerebellar tonsillar ectopia, unchanged.  There is a punctate, cortical focus of moderately increased diffusion-weighted signal in the left parietal lobe, too small to adequately correlate for restricted diffusion on the ADC map (series 4, image 24). There is no evidence of intracranial hemorrhage, mass, midline  shift, or extra-axial fluid collection. There is mild, age-related cerebral atrophy. Several small foci of T2 hyperintensity within the cerebral white matter are nonspecific and are greater than expected for patient's age. Small, remote inferior right cerebellar infarct is unchanged.  Orbits are unremarkable. Minimal bilateral maxillary sinus mucosal thickening is noted. Mastoid air cells are clear. Lack of a normal distal right vertebral artery flow void suggests proximal occlusion. Other major intracranial vascular flow voids are grossly preserved.  MRA HEAD FINDINGS  The visualized distal left vertebral artery is patent and dominant. Minimal flow related enhancement is seen in the distal right vertebral artery near the PICA origin and extending up to the vertebrobasilar junction. No definite flow related enhancement is seen proximal to this, most likely representing proximal occlusion. PICA origins are patent bilaterally. SCA origins are patent. Basilar artery is patent without stenosis. PCAs are unremarkable aside from mild distal branch vessel attenuation.  Internal carotid arteries are patent from skullbase to carotid termini. ACAs and MCAs are unremarkable aside from very mild distal MCA branch vessel attenuation bilaterally, similar to prior. No intracranial aneurysm is identified.  IMPRESSION: 1. 2 mm acute/subacute left parietal cortical infarct versus artifact. No evidence of large territory infarct. 2. Unchanged, remote right cerebellar infarct. 3. 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 No evidence of new intracranial arterial occlusion or significant proximal stenosis.   Electronically Signed   By:  Sebastian Ache   On: 05/05/2014 20:48   Mr Mra Head/brain Wo Cm  05/05/2014   CLINICAL DATA:  Sudden onset of headache, right-sided head and face paresthesia, speech difficulty, swallowing difficulty, right upper and right lower extremity numbness and difficulty walking. Stroke.  EXAM: MRI HEAD WITHOUT CONTRAST  MRA HEAD WITHOUT CONTRAST  TECHNIQUE: Multiplanar, multiecho pulse sequences of the brain and surrounding structures were obtained without intravenous contrast. Angiographic images of the head were obtained using MRA technique without contrast.  COMPARISON:  Head CT 05/04/2014.  Head MRI/ MRA 01/29/2010.  FINDINGS: MRI HEAD FINDINGS  There is 6 mm of cerebellar tonsillar ectopia, unchanged. There is a punctate, cortical focus of moderately increased diffusion-weighted signal in the left parietal lobe, too small to adequately correlate for restricted diffusion on the ADC map (series 4, image 24). There is no evidence of intracranial hemorrhage, mass, midline shift, or extra-axial fluid collection. There is mild, age-related cerebral atrophy. Several small foci of T2 hyperintensity within the cerebral white matter are nonspecific and are greater than expected for patient's age. Small, remote inferior right cerebellar infarct is unchanged.  Orbits are unremarkable. Minimal bilateral maxillary sinus mucosal thickening is noted. Mastoid air cells are clear. Lack of a normal distal right vertebral artery flow void suggests proximal occlusion. Other major intracranial vascular flow voids are grossly preserved.  MRA HEAD FINDINGS  The visualized distal left vertebral artery is patent and dominant. Minimal flow related enhancement is seen in the distal right vertebral artery near the PICA origin and extending up to the vertebrobasilar junction. No definite flow related enhancement is seen proximal to this, most likely representing proximal occlusion. PICA origins are patent bilaterally. SCA origins are patent.  Basilar artery is patent without stenosis. PCAs are unremarkable aside from mild distal branch vessel attenuation.  Internal carotid arteries are patent from skullbase to carotid termini. ACAs and MCAs are unremarkable aside from very mild distal MCA branch vessel attenuation bilaterally, similar to prior. No intracranial aneurysm is identified.  IMPRESSION: 1. 2 mm acute/subacute left parietal  cortical infarct versus artifact. No evidence of large territory infarct. 2. Unchanged, remote right cerebellar infarct. 3. 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 No evidence of new intracranial arterial occlusion or significant proximal stenosis.   Electronically Signed   By: Sebastian Ache   On: 05/05/2014 20:48   2 D echo: Study Conclusions  - Left ventricle: Possible small mid cavitary gradient. The cavity size was normal. Wall thickness was increased in a pattern of mild LVH. Systolic function was normal. The estimated ejection fraction was in the range of 60% to 65%. - Left atrium: The atrium was mildly dilated. - Atrial septum: No defect or patent foramen ovale was identified.   Carotid Doppler: Summary: Bilateral: 1-39% ICA stenosis. Vertebral artery flow is antegrade. Right: ICA/CCA ratio is 0.82. Left: ICA/CCA ratio is 0.98.  A1c--5.9 LDL--110   Assessment and plan discussed with with attending physician and they are in agreement.    Felicie Morn PA-C [facility] [phone]  05/06/2014, 12:22 PM   Assessment: 68 y.o. male with transient left face arm and leg numbness which fully resolved in 20-25 minutes.  MRI shows a punctate left parietal infarct. Stroke work up finished.   Stroke Risk Factors - none  Recommend: 1) Agree with plavix and Lipitor 2) Follow up with neurology in 1-2 months as out patient.  I personally participate in this patient's evaluation and management, including formulating the above clinical impression and  management recommendations.  Venetia Maxon M.D. [facility] [phone]

## 2014-05-06 NOTE — Plan of Care (Signed)
Problem: Acute Treatment Outcomes Goal: 02 Sats > 94% Outcome: Completed/Met Date Met:  05/06/14 Pt on Room air without complications

## 2014-05-06 NOTE — Progress Notes (Signed)
Discharge summary faxed to Dr Molly Maduroobert Kelly/ [facility] PCP as patient requested; fax # [phone]; Abelino DerrickB Chandler RN,BSN,MHA [phone]

## 2014-05-06 NOTE — Discharge Summary (Signed)
Daniel Harper, is a 68 y.o. male  DOB 08/13/1946  MRN [id number].  Admission date:  05/04/2014  Admitting Physician  Hillary Bow, DO  Discharge Date:  05/06/2014   Primary MD  Gregary Signs, MD  Recommendations for primary care physician for things to follow:   Monitor second of this factors for stroke and   Admission Diagnosis  Transient cerebral ischemia, unspecified transient cerebral ischemia type [435.9]   Discharge Diagnosis  CVA     Principal Problem:   TIA (transient ischemic attack)      History reviewed. No pertinent past medical history.  Past Surgical History  Procedure Laterality Date  . Hernia repair       Discharge Condition: stable   Follow UP  Follow-up Information   Follow up with KELLY,Jimmie, MD. Schedule an appointment as soon as possible for a visit in 1 week.   Specialty:  Family Medicine   Contact information:   [facility] [address] [place] [address] [phone]       Follow up with [facility]. Schedule an appointment as soon as possible for a visit in 1 week.   Contact information:   [address] [place] [address] [phone]        Discharge Instructions  and  Discharge Medications     Discharge Instructions   Diet - low sodium heart healthy    Complete by:  As directed      Discharge instructions    Complete by:  As directed   Follow with Primary MD Gregary Signs, MD in 7 days   Get CBC, CMP checked  by Primary MD next visit.    Activity: As tolerated with Full fall precautions use walker/cane & assistance as needed   Disposition Home     Diet: Heart Healthy  .  For Heart failure patients - Check your Weight same time everyday, if you gain over 2 pounds, or you develop in leg swelling,  experience more shortness of breath or chest pain, call your Primary MD immediately. Follow Cardiac Low Salt Diet and 1.8 lit/day fluid restriction.   On your next visit with her primary care physician please Get Medicines reviewed and adjusted.  Please request your Prim.MD to go over all Hospital Tests and Procedure/Radiological results at the follow up, please get all Hospital records sent to your Prim MD by signing hospital release before you go home.   If you experience worsening of your admission symptoms, develop shortness of breath, life threatening emergency, suicidal or homicidal thoughts you must seek medical attention immediately by calling 911 or calling your MD immediately  if symptoms less severe.  You Must read complete instructions/literature along with all the possible adverse reactions/side effects for all the Medicines you take and that have been prescribed to you. Take any new Medicines after you have completely understood and accpet all the possible adverse reactions/side effects.   Do not drive and provide baby sitting services if your were admitted for syncope or siezures until  you have seen by Primary MD or a Neurologist and advised to do so again.  Do not drive when taking Pain medications.    Do not take more than prescribed Pain, Sleep and Anxiety Medications  Special Instructions: If you have smoked or chewed Tobacco  in the last 2 yrs please stop smoking, stop any regular Alcohol  and or any Recreational drug use.  Wear Seat belts while driving.   Please note  You were cared for by a hospitalist during your hospital stay. If you have any questions about your discharge medications or the care you received while you were in the hospital after you are discharged, you can call the unit and asked to speak with the hospitalist on call if the hospitalist that took care of you is not available. Once you are discharged, your primary care physician will handle any further  medical issues. Please note that NO REFILLS for any discharge medications will be authorized once you are discharged, as it is imperative that you return to your primary care physician (or establish a relationship with a primary care physician if you do not have one) for your aftercare needs so that they can reassess your need for medications and monitor your lab values.                                                       Daniel Harper was admitted to the Hospital on 05/04/2014 and Discharged  05/06/2014 and should be excused from work/school   for 5  days starting 05/04/2014 , may return to work/school without any restrictions.  Call Susa Raring MD, [facility] [phone] with questions.  Leroy Sea M.D on 05/06/2014,at 10:11 AM  [facility]   Office  [phone]  Follow with Primary MD Gregary Signs, MD in 7 days   Get CBC, CMP checked  by Primary MD next visit.    Activity: As tolerated with Full fall precautions use walker/cane & assistance as needed   Disposition Home     Diet: Heart Healthy  .  For Heart failure patients - Check your Weight same time everyday, if you gain over 2 pounds, or you develop in leg swelling, experience more shortness of breath or chest pain, call your Primary MD immediately. Follow Cardiac Low Salt Diet and 1.8 lit/day fluid restriction.   On your next visit with her primary care physician please Get Medicines reviewed and adjusted.  Please request your Prim.MD to go over all Hospital Tests and Procedure/Radiological results at the follow up, please get all Hospital records sent to your Prim MD by signing hospital release before you go home.   If you experience worsening of your admission symptoms, develop shortness of breath, life threatening emergency, suicidal or homicidal thoughts you must seek medical attention immediately by calling 911 or calling your MD immediately  if symptoms less severe.  You Must read  complete instructions/literature along with all the possible adverse reactions/side effects for all the Medicines you take and that have been prescribed to you. Take any new Medicines after you have completely understood and accpet all the possible adverse reactions/side effects.   Do not drive and provide baby sitting services if your were admitted for syncope or siezures until you have seen by Primary MD or a Neurologist and advised to do so again.  Do not drive when taking Pain medications.    Do not take more than prescribed Pain, Sleep and Anxiety Medications  Special Instructions: If you have smoked or chewed Tobacco  in the last 2 yrs please stop smoking, stop any regular Alcohol  and or any Recreational drug use.  Wear Seat belts while driving.   Please note  You were cared for by a hospitalist during your hospital stay. If you have any questions about your discharge medications or the care you received while you were in the hospital after you are discharged, you can call the unit and asked to speak with the hospitalist on call if the hospitalist that took care of you is not available. Once you are discharged, your primary care physician will handle any further medical issues. Please note that NO REFILLS for any discharge medications will be authorized once you are discharged, as it is imperative that you return to your primary care physician (or establish a relationship with a primary care physician if you do not have one) for your aftercare needs so that they can reassess your need for medications and monitor your lab values.                                                       Daniel Harper was admitted to the Hospital on 05/04/2014 and Discharged  05/06/2014 and should be excused from work/school   for 5  days starting 05/04/2014 , may return to work/school without any restrictions.  Call Susa Raring MD, [facility] [phone] with questions.  Leroy Sea  M.D on 05/06/2014,at 10:11 AM  [facility]   Office  [phone]     Increase activity slowly    Complete by:  As directed             Medication List    STOP taking these medications       aspirin EC 81 MG tablet      TAKE these medications       atorvastatin 10 MG tablet  Commonly known as:  LIPITOR  Take 1 tablet (10 mg total) by mouth daily at 6 PM.     clopidogrel 75 MG tablet  Commonly known as:  PLAVIX  Take 1 tablet (75 mg total) by mouth daily with breakfast.     ibuprofen 200 MG tablet  Commonly known as:  ADVIL,MOTRIN  Take 200-800 mg by mouth every 6 (six) hours as needed for fever, headache, mild pain, moderate pain or cramping.     multivitamin tablet  Take 1 tablet by mouth daily.          Diet and Activity recommendation: See Discharge Instructions above   Consults obtained - Neuro   Major procedures and Radiology Reports - PLEASE review detailed and final reports for all details, in brief -   Echo  - Left ventricle: Possible small mid cavitary gradient. The cavity size was normal. Wall thickness was increased in a pattern of mild LVH. Systolic function was normal. The estimated ejection fraction was in the range of 60% to 65%. - Left atrium: The atrium was mildly dilated. - Atrial septum: No defect or patent foramen ovale was identified.    Bilateral carotid artery duplex: 1-39% ICA stenosis. Vertebral artery flow is antegrade.    Ct Head Wo Contrast  05/04/2014  CLINICAL DATA:  Numbness.  Stroke symptoms.  EXAM: CT HEAD WITHOUT CONTRAST  TECHNIQUE: Contiguous axial images were obtained from the base of the skull through the vertex without intravenous contrast.  COMPARISON:  01/29/2010  FINDINGS: Subtle remote peripheral right cerebellar infarct, no change from 2011.  The brainstem, cerebral peduncle is, thalami, basal ganglia, ventricular system, and basilar cisterns appear otherwise unremarkable aside from likely physiologic  calcification in the globus pallidus nuclei.  No intracranial hemorrhage, mass lesion, or acute CVA.  IMPRESSION: 1. No acute intracranial findings. 2. Small remote right cerebellar infarct.   Electronically Signed   By: Herbie Baltimore M.D.   On: 05/04/2014 18:18   Mr Brain Wo Contrast  05/05/2014   CLINICAL DATA:  Sudden onset of headache, right-sided head and face paresthesia, speech difficulty, swallowing difficulty, right upper and right lower extremity numbness and difficulty walking. Stroke.  EXAM: MRI HEAD WITHOUT CONTRAST  MRA HEAD WITHOUT CONTRAST  TECHNIQUE: Multiplanar, multiecho pulse sequences of the brain and surrounding structures were obtained without intravenous contrast. Angiographic images of the head were obtained using MRA technique without contrast.  COMPARISON:  Head CT 05/04/2014.  Head MRI/ MRA 01/29/2010.  FINDINGS: MRI HEAD FINDINGS  There is 6 mm of cerebellar tonsillar ectopia, unchanged. There is a punctate, cortical focus of moderately increased diffusion-weighted signal in the left parietal lobe, too small to adequately correlate for restricted diffusion on the ADC map (series 4, image 24). There is no evidence of intracranial hemorrhage, mass, midline shift, or extra-axial fluid collection. There is mild, age-related cerebral atrophy. Several small foci of T2 hyperintensity within the cerebral white matter are nonspecific and are greater than expected for patient's age. Small, remote inferior right cerebellar infarct is unchanged.  Orbits are unremarkable. Minimal bilateral maxillary sinus mucosal thickening is noted. Mastoid air cells are clear. Lack of a normal distal right vertebral artery flow void suggests proximal occlusion. Other major intracranial vascular flow voids are grossly preserved.  MRA HEAD FINDINGS  The visualized distal left vertebral artery is patent and dominant. Minimal flow related enhancement is seen in the distal right vertebral artery near the PICA  origin and extending up to the vertebrobasilar junction. No definite flow related enhancement is seen proximal to this, most likely representing proximal occlusion. PICA origins are patent bilaterally. SCA origins are patent. Basilar artery is patent without stenosis. PCAs are unremarkable aside from mild distal branch vessel attenuation.  Internal carotid arteries are patent from skullbase to carotid termini. ACAs and MCAs are unremarkable aside from very mild distal MCA branch vessel attenuation bilaterally, similar to prior. No intracranial aneurysm is identified.  IMPRESSION: 1. 2 mm acute/subacute left parietal cortical infarct versus artifact. No evidence of large territory infarct. 2. Unchanged, remote right cerebellar infarct. 3. 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 No evidence of new intracranial arterial occlusion or significant proximal stenosis.   Electronically Signed   By: Sebastian Ache   On: 05/05/2014 20:48   Mr Mra Head/brain Wo Cm  05/05/2014   CLINICAL DATA:  Sudden onset of headache, right-sided head and face paresthesia, speech difficulty, swallowing difficulty, right upper and right lower extremity numbness and difficulty walking. Stroke.  EXAM: MRI HEAD WITHOUT CONTRAST  MRA HEAD WITHOUT CONTRAST  TECHNIQUE: Multiplanar, multiecho pulse sequences of the brain and surrounding structures were obtained without intravenous contrast. Angiographic images of the head were obtained using MRA technique without contrast.  COMPARISON:  Head CT 05/04/2014.  Head MRI/  MRA 01/29/2010.  FINDINGS: MRI HEAD FINDINGS  There is 6 mm of cerebellar tonsillar ectopia, unchanged. There is a punctate, cortical focus of moderately increased diffusion-weighted signal in the left parietal lobe, too small to adequately correlate for restricted diffusion on the ADC map (series 4, image 24). There is no evidence of intracranial hemorrhage, mass, midline shift, or extra-axial fluid  collection. There is mild, age-related cerebral atrophy. Several small foci of T2 hyperintensity within the cerebral white matter are nonspecific and are greater than expected for patient's age. Small, remote inferior right cerebellar infarct is unchanged.  Orbits are unremarkable. Minimal bilateral maxillary sinus mucosal thickening is noted. Mastoid air cells are clear. Lack of a normal distal right vertebral artery flow void suggests proximal occlusion. Other major intracranial vascular flow voids are grossly preserved.  MRA HEAD FINDINGS  The visualized distal left vertebral artery is patent and dominant. Minimal flow related enhancement is seen in the distal right vertebral artery near the PICA origin and extending up to the vertebrobasilar junction. No definite flow related enhancement is seen proximal to this, most likely representing proximal occlusion. PICA origins are patent bilaterally. SCA origins are patent. Basilar artery is patent without stenosis. PCAs are unremarkable aside from mild distal branch vessel attenuation.  Internal carotid arteries are patent from skullbase to carotid termini. ACAs and MCAs are unremarkable aside from very mild distal MCA branch vessel attenuation bilaterally, similar to prior. No intracranial aneurysm is identified.  IMPRESSION: 1. 2 mm acute/subacute left parietal cortical infarct versus artifact. No evidence of large territory infarct. 2. Unchanged, remote right cerebellar infarct. 3. 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 No evidence of new intracranial arterial occlusion or significant proximal stenosis.   Electronically Signed   By: Sebastian AcheAllen  Grady   On: 05/05/2014 20:48    Micro Results      No results found for this or any previous visit (from the past 240 hour(s)).   History of present illness and  Hospital Course:     Kindly see H&P for history of present illness and admission details, please review complete Labs,  Consult reports and Test reports for all details in brief Daniel Harper, is a 68 y.o. male, patient with history of  TIA in the past presented with right-sided tingling numbness and weakness along with expressive aphasia which lasted about 5-10 minutes and resolve by the time he came to the ER, his workup did confirm a small CVA on MRI, he was seen by neurology, since he was taking baby aspirin prior to admission he was switched to Plavix, LDL was greater than 100 and placed on statin. Echogram and carotid duplex unremarkable, cleared by PT OT and speech, cleared by neuro for home discharge. We'll follow with PCP and neurology outpatient within a timely fashion post discharge.     Today   Subjective:   Daniel EhrichRobert Harper today has no headache,no chest abdominal pain,no new weakness tingling or numbness, feels much better wants to go home today.    Objective:   Blood pressure 127/70, pulse 55, temperature 98.4 F (36.9 C), temperature source Oral, resp. rate 18, height 6\' 3"  (1.905 m), weight 98.431 kg (217 lb), SpO2 98.00%.   Intake/Output Summary (Last 24 hours) at 05/06/14 1012 Last data filed at 05/06/14 0900  Gross per 24 hour  Intake    480 ml  Output      0 ml  Net    480 ml    Exam  Awake Alert, Oriented x 3, No new F.N deficits, Normal affect [place].AT,PERRAL Supple Neck,No JVD, No cervical lymphadenopathy appriciated.  Symmetrical Chest wall movement, Good air movement bilaterally, CTAB RRR,No Gallops,Rubs or new Murmurs, No Parasternal Heave +ve B.Sounds, Abd Soft, Non tender, No organomegaly appriciated, No rebound -guarding or rigidity. No Cyanosis, Clubbing or edema, No new Rash or bruise  Data Review   Lab Results  Component Value Date   CHOL 156 05/05/2014   HDL 34* 05/05/2014   LDLCALC 110* 05/05/2014   TRIG 60 05/05/2014   CHOLHDL 4.6 05/05/2014   Lab Results  Component Value Date   HGBA1C 5.9* 05/05/2014      CBC w Diff: Lab Results  Component Value Date    WBC 5.3 05/04/2014   HGB 15.6 05/04/2014   HCT 46.0 05/04/2014   PLT 177 05/04/2014   LYMPHOPCT 21 05/04/2014   MONOPCT 6 05/04/2014   EOSPCT 0 05/04/2014   BASOPCT 1 05/04/2014    CMP: Lab Results  Component Value Date   NA 142 05/04/2014   K 3.7 05/04/2014   CL 103 05/04/2014   CO2 24 05/04/2014   BUN 13 05/04/2014   CREATININE 1.00 05/04/2014   PROT 6.9 05/04/2014   ALBUMIN 3.8 05/04/2014   BILITOT 0.6 05/04/2014   ALKPHOS 37* 05/04/2014   AST 21 05/04/2014   ALT 14 05/04/2014  .   Total Time in preparing paper work, data evaluation and todays exam - 35 minutes  Leroy SeaSINGH,PRASHANT K M.D on 05/06/2014 at 10:12 AM  [facility] Office  [phone]   **Disclaimer: This note may have been dictated with voice recognition software. Similar sounding words can inadvertently be transcribed and this note may contain transcription errors which may not have been corrected upon publication of note.**

## 2014-05-06 NOTE — Progress Notes (Signed)
Pt given D/C paperwork, all questions answered and pt with no further questions. IV removed and site intact. Pt resting awaiting for wife to take home with no distress. Will continue to monitor.

## 2014-05-06 NOTE — Progress Notes (Signed)
Occupational Therapy Evaluation and Discharge Patient Details Name: Daniel EhrichRobert Harper MRN: [id number] DOB: 09/12/1946 Today's Date: 05/06/2014    History of Present Illness Daniel Harper is a 68 y.o. male admitted 05/04/14 due to c/o sudden onset HA, R sided head and face paresthesia, difficulty with speech and swallowing, RUE and RLE numbness and difficulty walking. Symptoms occurred at work and lasted about 25 mins. MRI demonstrates an acute/subacute left parietal cortical infarct and an unchanged remote cerebellar infarct.    Clinical Impression   PTA pt was independent with ADLs and functional mobility. Pt reports hx of CVA about 4 years ago with no residual deficits. Pt currently at Independent level for ADLs and functional mobility. Educated pt on safety and signs and symptoms of CVA using F.A.S.T. No further acute OT needs.     Follow Up Recommendations  No OT follow up;Supervision - Intermittent    Equipment Recommendations  None recommended by OT       Precautions / Restrictions Precautions Precautions: None Restrictions Weight Bearing Restrictions: No      Mobility Bed Mobility Overal bed mobility: Independent                Transfers Overall transfer level: Independent                         ADL Overall ADL's : At baseline;Independent                                       General ADL Comments: Pt moving at Independent level with no c/o of dizziness, numbness, or weakness. No LOB during functional mobility. Educated pt on stress management including meditation, deep breathing, and exercise. Edcuated pt on signs and symptoms of stroke using F.A.S.T and safety with ADLs. Pt had many questions regarding stroke, carotid doppler, and echocardiogram and OT deferred pt to discuss with MD.      Vision Eye Alignment: Within Functional Limits Alignment/Gaze Preference: Within Defined Limits Ocular Range of Motion: Within Functional  Limits Tracking/Visual Pursuits: Able to track stimulus in all quads without difficulty Saccades: Within functional limits Convergence: Within functional limits         Perception Perception Perception Tested?: No   Praxis Praxis Praxis tested?: Within functional limits    Pertinent Vitals/Pain NAD     Hand Dominance Right   Extremity/Trunk Assessment Upper Extremity Assessment Upper Extremity Assessment: Overall WFL for tasks assessed   Lower Extremity Assessment Lower Extremity Assessment: Overall WFL for tasks assessed   Cervical / Trunk Assessment Cervical / Trunk Assessment: Normal   Communication Communication Communication: No difficulties   Cognition Arousal/Alertness: Awake/alert Behavior During Therapy: WFL for tasks assessed/performed Overall Cognitive Status: Within Functional Limits for tasks assessed                                Home Living Family/patient expects to be discharged to:: Private residence Living Arrangements: Spouse/significant other Available Help at Discharge: Family;Available 24 hours/day Type of Home: House Home Access: Stairs to enter Entergy CorporationEntrance Stairs-Number of Steps: 6 Entrance Stairs-Rails: Right Home Layout: Able to live on main level with bedroom/bathroom;Laundry or work area in [place] Alternate Level Stairs-Number of Steps: 12 Alternate Level Stairs-Rails: Left           Home Equipment: None  Prior Functioning/Environment Level of Independence: Independent        Comments: Works for [employer] and reports high stress level due to dealing with irate customer calls                              End of Session  Activity Tolerance: Patient tolerated treatment well Patient left: in chair;with call bell/phone within reach   Time: 0900-0923 OT Time Calculation (min): 23 min Charges:  OT General Charges $OT Visit: 1 Procedure OT Evaluation $Initial OT Evaluation Tier I: 1 Procedure OT  Treatments $Self Care/Home Management : 8-22 mins  Rae LipsMiller, LeeAnn [phone] 05/06/2014, 9:40 AM

## 2014-05-06 NOTE — Progress Notes (Signed)
Pt ambulated out to d/c area where wife was awaiting to pick him up. No distress or complications

## 2014-05-06 NOTE — Progress Notes (Signed)
Pt discuss with this nurse concerns of the cost of the new medication Lipitor that he would need to take. Pt stated that he wanted to talked to the doctor about generic brand.

## 2014-05-14 ENCOUNTER — Telehealth: Payer: Self-pay | Admitting: Neurology

## 2014-06-18 NOTE — Telephone Encounter (Signed)
Noted  

## 2014-07-09 ENCOUNTER — Ambulatory Visit (INDEPENDENT_AMBULATORY_CARE_PROVIDER_SITE_OTHER): Payer: BC Managed Care – PPO | Admitting: Neurology

## 2014-07-09 ENCOUNTER — Encounter: Payer: Self-pay | Admitting: Neurology

## 2014-07-09 VITALS — BP 152/91 | HR 77 | Ht 75.0 in | Wt 214.2 lb

## 2014-07-09 DIAGNOSIS — G450 Vertebro-basilar artery syndrome: Secondary | ICD-10-CM

## 2014-07-09 DIAGNOSIS — I6509 Occlusion and stenosis of unspecified vertebral artery: Secondary | ICD-10-CM

## 2014-07-09 DIAGNOSIS — G43109 Migraine with aura, not intractable, without status migrainosus: Secondary | ICD-10-CM

## 2014-07-09 DIAGNOSIS — I6501 Occlusion and stenosis of right vertebral artery: Secondary | ICD-10-CM

## 2014-07-09 DIAGNOSIS — G459 Transient cerebral ischemic attack, unspecified: Secondary | ICD-10-CM

## 2014-07-09 NOTE — Progress Notes (Signed)
NEUROLOGY CLINIC NEW PATIENT NOTE  NAME: Daniel Harper DOB: [birth year]  I saw Daniel Harper as a new patient in the neurovascular clinic today regarding  Chief Complaint  Patient presents with  . Cerebrovascular Accident    NP#1  .  HPI: Daniel Harper is a 68 y.o. male with PMH of migraine aura who presents as a new patient for hospital f/u of TIA event.   He stated that on 05/04/14 he was working in office looking at computer, he sneezed hard at one time, right after this, he had right face tingling numbness lasted 2-3 min, associated with slurry speech, need effort to get words out, and difficult swallowing. He went to talk with supervisor, but felt unsteady on walking but unsteady but no fall or leaning toward one side. Denies any dizziness, N/V, headache, LOC, double vision. When EMS came, he is much improved. By the time in ER, his symptoms resolved. MRI and CT showed no acute infarct but chronic small right cerebellar infarct, and MRA showed chronic right VA occlusion. He was switched from ASA to plavix. And discharged with ASA and lipitor for stroke prevention.  In 2011 he had transient double vision, difficulty with balance due to vision change, lasting 2-3 min, was sitting in the desk and able to speak and move. He did MRI showed chronic small right cerebella stroke, and pt asymptomatic.  He has migraine visual aura in the past but no HA. Usually every 6 months, but for the last one month, he had the visual aura 3-4 times a week, and sometimes 2-3 times a day. His migraine aura was described as spiky shaped objects shimmering and bright, gradually getting better, lasting , without headache.    Past Medical History  Diagnosis Date  . Stroke    Past Surgical History  Procedure Laterality Date  . Hernia repair    . Rotator cuff repair     Family History  Problem Relation Age of Onset  . Stroke Mother   . Hypertension Mother   . Diabetes Mother   . Cancer  Mother   . Stroke Father   . Hypertension Father   . Diabetes Father    Current Outpatient Prescriptions  Medication Sig Dispense Refill  . atorvastatin (LIPITOR) 10 MG tablet Take 1 tablet (10 mg total) by mouth daily at 6 PM.  30 tablet  0  . clopidogrel (PLAVIX) 75 MG tablet Take 1 tablet (75 mg total) by mouth daily with breakfast.  30 tablet  0  . ibuprofen (ADVIL,MOTRIN) 200 MG tablet Take 200-800 mg by mouth every 6 (six) hours as needed for fever, headache, mild pain, moderate pain or cramping.      Marland Kitchen losartan (COZAAR) 25 MG tablet Take 25 mg by mouth daily.      . Multiple Vitamin (MULTIVITAMIN) tablet Take 1 tablet by mouth daily.      . Multiple Vitamins-Minerals (MULTIVITAL) tablet Take 1 tablet by mouth.      . tadalafil (CIALIS) 20 MG tablet TAKE AS DIRECTED       No current facility-administered medications for this visit.   Allergies  Allergen Reactions  . Aspirin Swelling    Of throat   History   Social History  . Marital Status: Married    Spouse Name: N/A    Number of Children: 0  . Years of Education: college   Occupational History  .  Time [employer]   Social History Main Topics  . Smoking status: Former  Smoker    Types: Cigarettes    Quit date: 05/05/1967  . Smokeless tobacco: Not on file  . Alcohol Use: No  . Drug Use: No  . Sexual Activity: Yes   Other Topics Concern  . Not on file   Social History Narrative  . No narrative on file    Review of Systems Full 14 system review of systems performed and notable only for those listed, all others are neg:  Constitutional: N/A  Cardiovascular: N/A  Ear/Nose/Throat: ringing in the ear  Skin: itching  Eyes: N/A  Respiratory: N/A  Gastroitestinal: N/A  Hematology/Lymphatic: N/A  Endocrine: N/A  Musculoskeletal: N/A  Allergy/Immunology: N/A  Neurological: sleepiness  Psychiatric: N/A   Physical Exam  Filed Vitals:   07/09/14 1049  BP: 152/91  Pulse: 77    General - Well  nourished, well developed, in no apparent distress.  Ophthalmologic - Sharp disc margins OU.  Cardiovascular - Regular rate and rhythm with no murmur. Carotid pulses were 2+ without bruits .   Neck - supple, no nuchal rigidity .  Mental Status -  Level of arousal and orientation to time, place, and person were intact. Language including expression, naming, repetition, comprehension, reading, and writing was assessed and found intact. Attention span and concentration were normal. Recent and remote memory were intact. Fund of Knowledge was assessed and was intact.  Cranial Nerves II - XII - II - Visual field intact OU. III, IV, VI - Extraocular movements intact. V - Facial sensation intact bilaterally. VII - Facial movement intact bilaterally. VIII - Hearing & vestibular intact bilaterally. X - Palate elevates symmetrically. XI - Chin turning & shoulder shrug intact bilaterally. XII - Tongue protrusion intact.  Motor Strength - The patient's strength was normal in all extremities and pronator drift was absent.  Bulk was normal and fasciculations were absent.   Motor Tone - Muscle tone was assessed at the neck and appendages and was normal.  Reflexes - The patient's reflexes were normal in all extremities and he had no pathological reflexes.  Sensory - Light touch, temperature/pinprick, vibration and proprioception, and Romberg testing were assessed and were normal.    Coordination - The patient had normal movements in the hands and feet with no ataxia or dysmetria.  Tremor was absent.  Gait and Station - The patient's transfers, posture, gait, station, and turns were observed as normal.   Imaging MRI and MRA brain 05/05/14 1. 2 mm acute/subacute left parietal cortical infarct versus  artifact. No evidence of large territory infarct.  2. Unchanged, remote right cerebellar infarct.  3. 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 No evidence of  new intracranial arterial occlusion or  significant proximal stenosis.  MRI and MRA brain 01/2010 1. No acute or focal intracranial abnormality to explain the  patient's symptoms.  2. Remote lacunar infarct of the right cerebellum.  3. 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 The ectopia does not appear to be causing any  Obstruction. Echo  - Left ventricle: Possible small mid cavitary gradient. The cavity size was normal. Wall thickness was increased in a pattern of mild LVH. Systolic function was normal. The estimated ejection fraction was in the range of 60% to 65%. - Left atrium: The atrium was mildly dilated. - Atrial septum: No defect or patent foramen ovale was identified.  carotid artery duplex: 1-39% ICA stenosis. Vertebral artery flow is antegrade ??.   LDL 110 and A1C 5.9.  Assessments: NIHSS: 0 Rankin: 0   Assessment and Plan:   In summary, Brinson Tozzi is a 68 y.o. male with PMH of migraine visual aura presents transient right face numbess, slurry speech, difficulty swallowing and walking unstable in the setting of sneeze. MRI show no acute infarct but old small right cerebellar stroke. MRA showed chronic right VA occluded. He also had one transient episode of double vision in 2011. MRI and MRA at that time showed the same. So far symptoms all fit to the posterior circulation process. My Nelva Bush is that his TIA this time is due to sneeze which caused his cervical vertebra trasiently unstable, which caused left VA transient compression which triggered the events. In the setting of chronic right VA occlusion, left VA compression will cause posterior circulation symptoms. His chronic right VA occlusion could explain his right cerebellar old small stroke. He had extensive studied on last admissoin, but no neck images except CUS which showed b/l VA antegrade flow ??  - We will request a CTA neck to further image the VA cervical portion.  -  Continue plavix and lipitor for stroke prevention   - avoid vigorous neck manipulation - RTC in 4-6 weeks.  I recommend aggressive blood pressure control with a goal <130/80 mm Hg.  Lipids should be managed intensively, with a goal LDL < 70 mg/dL.  I encouraged the patient to discuss these important issues with his primary care physician.  I counseled the patient on measures to reduce stroke risk, including the importance of medication compliance, risk factor control, exercise, healthy diet, and avoidance of smoking.  I reviewed stroke warning signs and symptoms and appropriate actions to take if such occurs.   Thank you very much for the opportunity to participate in the care of this patient.  Please do not hesitate to call if any questions or concerns arise.  Orders Placed This Encounter  Procedures  . CT Angio Neck W/Cm &/Or Wo/Cm    Standing Status: Future     Number of Occurrences:      Standing Expiration Date: 09/09/2015    Order Specific Question:  Reason for Exam (SYMPTOM  OR DIAGNOSIS REQUIRED)    Answer:  posterior TIA to rule out vertebral occlusion/compression    Order Specific Question:  Preferred imaging location?    Answer:  [facility]    Patient Instructions  - schedule for CT angio neck to evaluate neck vessels. - continue plavix and lipitor for stroke prevention - continue BP monitoring at home - avoid vigorous neck manipulation - follow up in 4-6 weeks.   Marvel Plan, MD PhD [facility] [address] [place], [place] [address] [phone]

## 2014-07-09 NOTE — Patient Instructions (Signed)
-   schedule for CT angio neck to evaluate neck vessels. - continue plavix and lipitor for stroke prevention - continue BP monitoring at home - avoid vigorous neck manipulation - follow up in 4-6 weeks.

## 2014-08-20 ENCOUNTER — Ambulatory Visit: Payer: BC Managed Care – PPO | Admitting: Neurology

## 2014-12-11 IMAGING — CT CT HEAD W/O CM
2 series · 16 of 30 positions shown, 20 images · non-contrast
Comparison: 01/29/2010

CLINICAL DATA: Numbness.  Stroke symptoms.

EXAM:
CT HEAD WITHOUT CONTRAST
TECHNIQUE: Contiguous axial images were obtained from the base of the skull
through the vertex without intravenous contrast.

[Series 201: head w/o, idose (1) · axial · non-contrast · 0.49mm/px · z∈[+62,+192]mm · 13 of 32 slices shown, 17 images]
[im 3/32  brain]
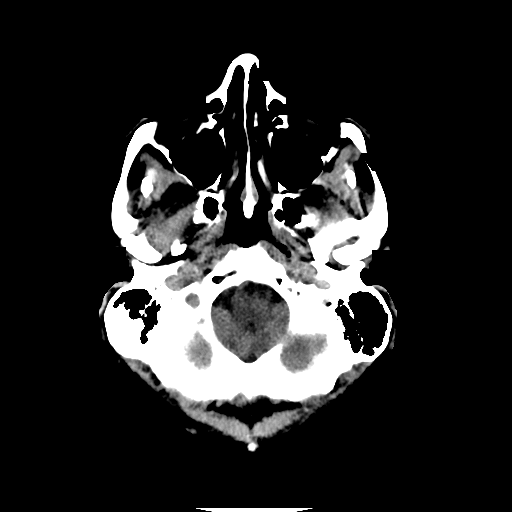
[im 3/32  bone]
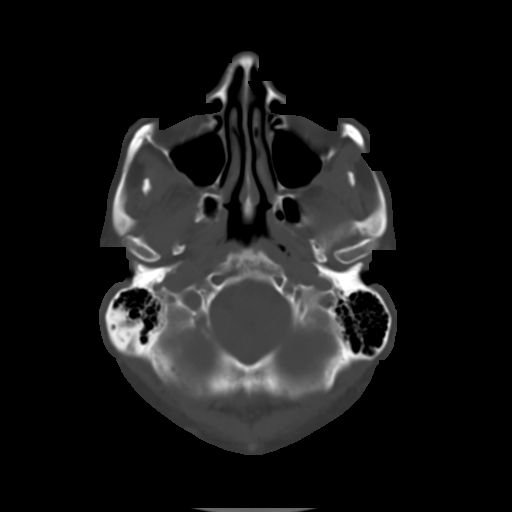
[im 5/32  brain]
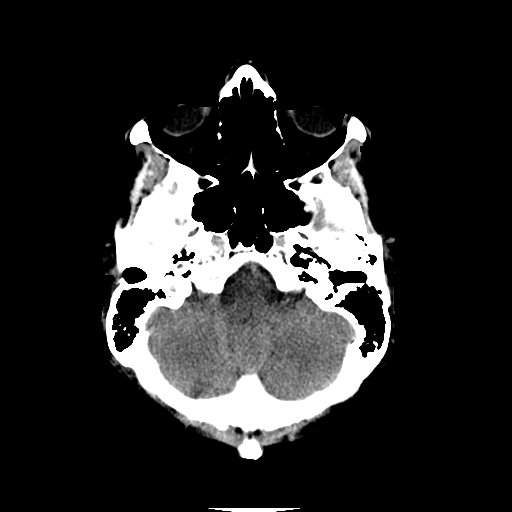
[im 7/32  brain]
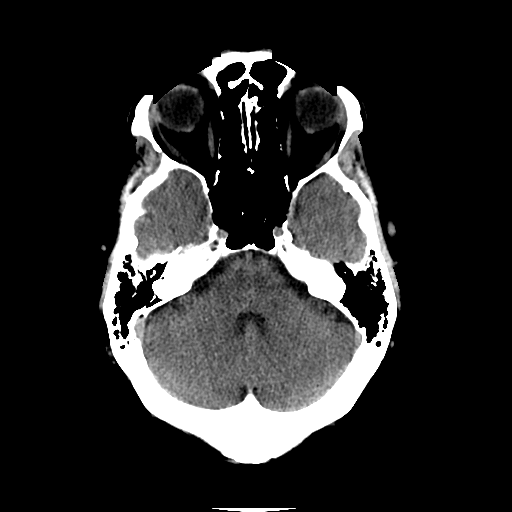
[im 9/32  brain]
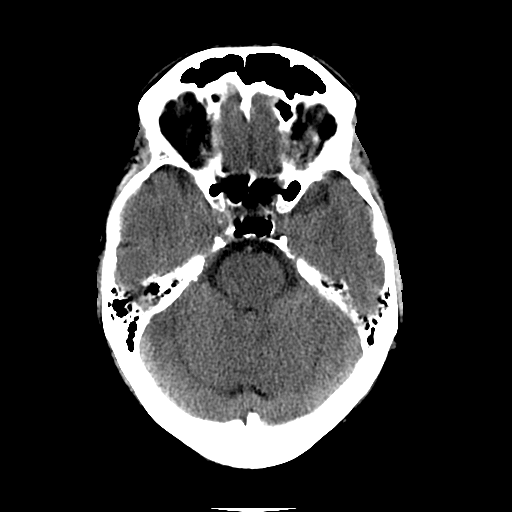
[im 12/32  brain]
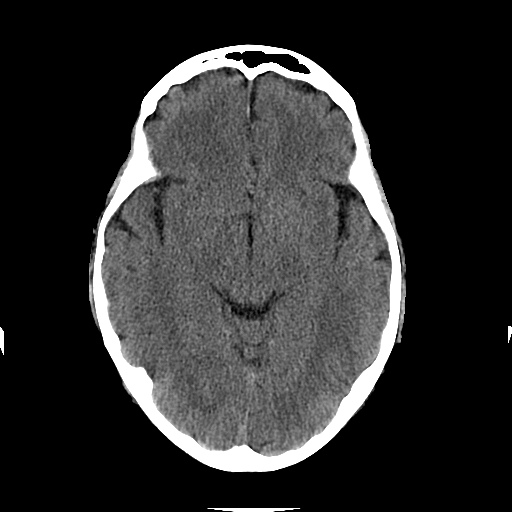
[im 12/32  bone]
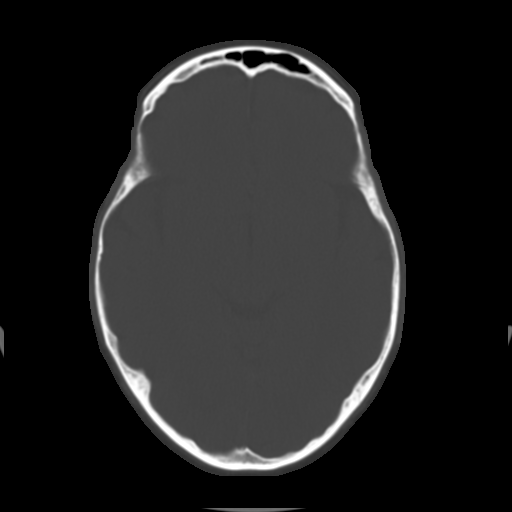
[im 14/32  brain]
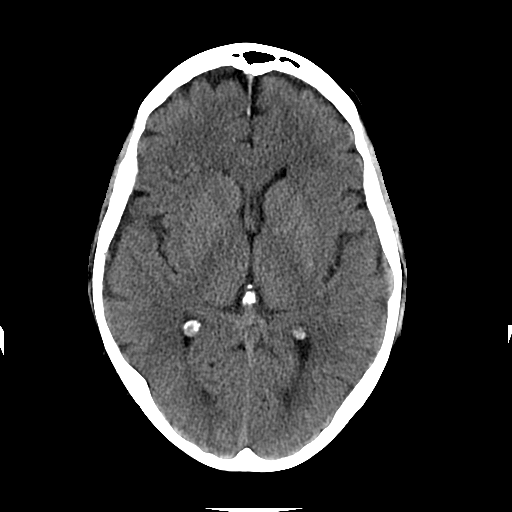
[im 16/32  brain]
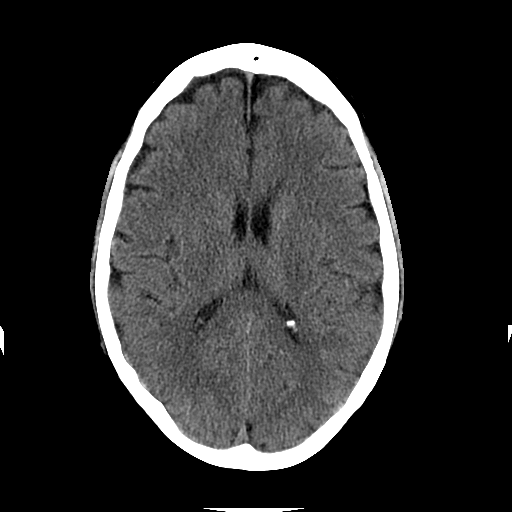
[im 18/32  brain]
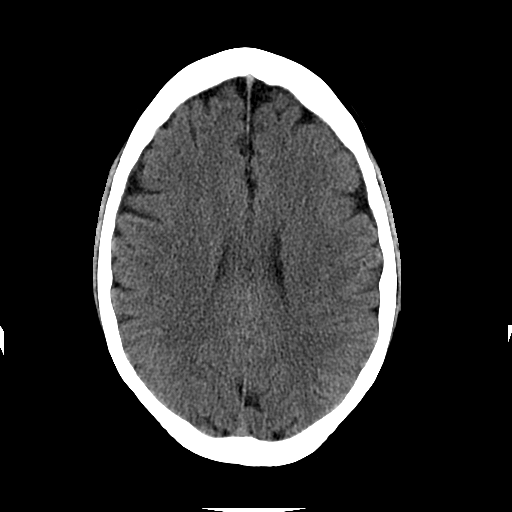
[im 20/32  brain]
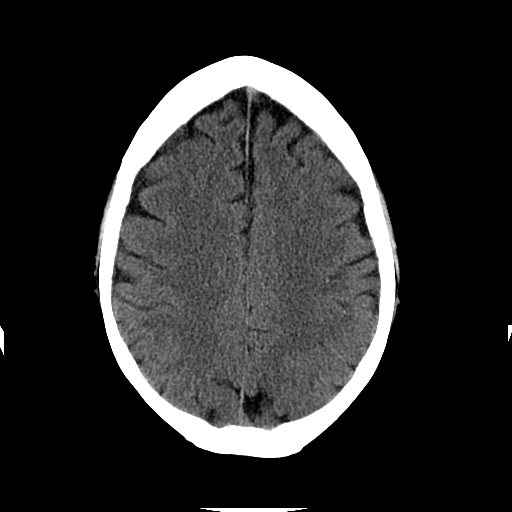
[im 20/32  bone]
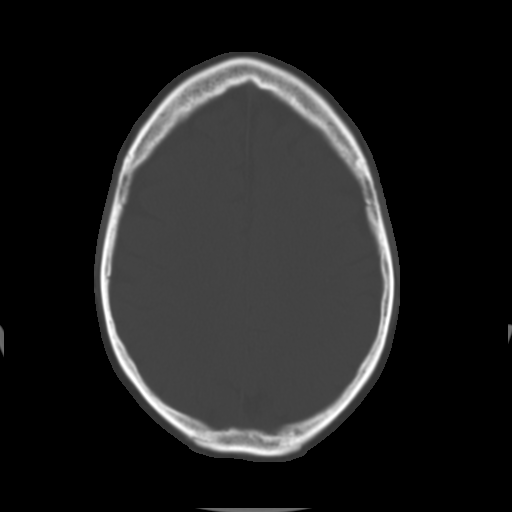
[im 23/32  brain]
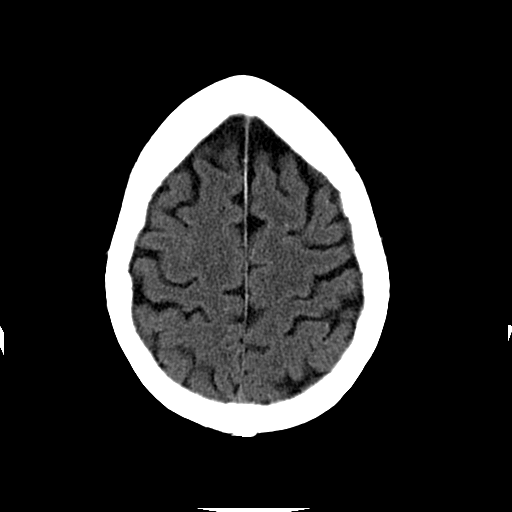
[im 25/32  brain]
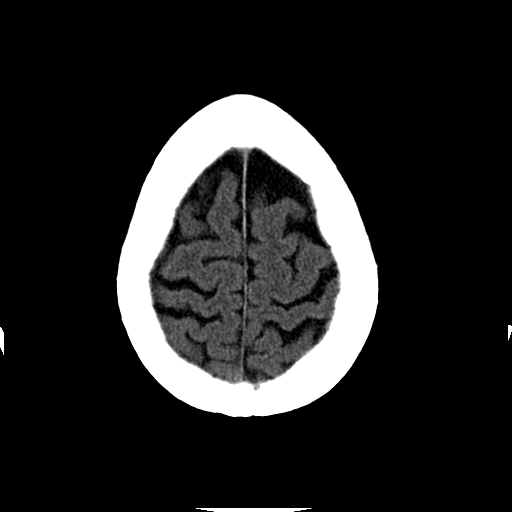
[im 27/32  brain]
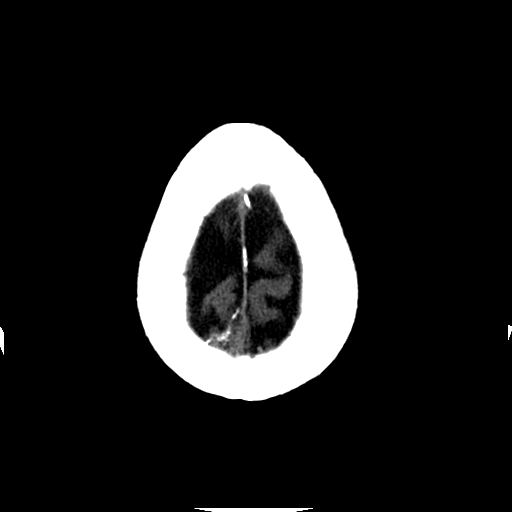
[im 29/32  brain]
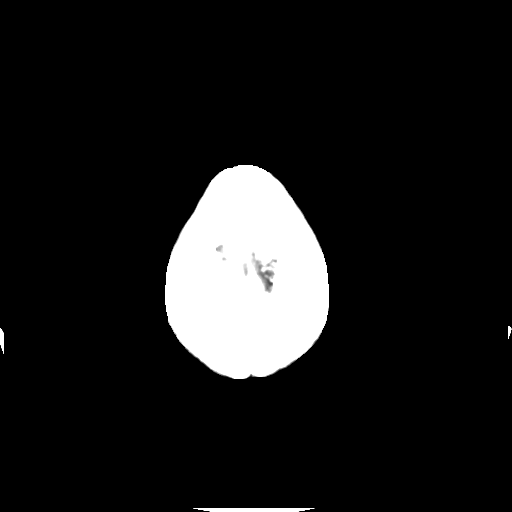
[im 29/32  bone]
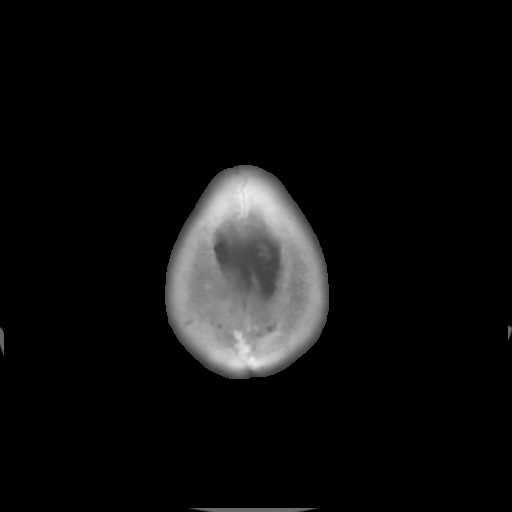

[Series 202: head w/o bone, idose (1) · axial · non-contrast · 0.49mm/px · z∈[+62,+107]mm · 3 of 32 slices shown]
[im 3/32  bone]
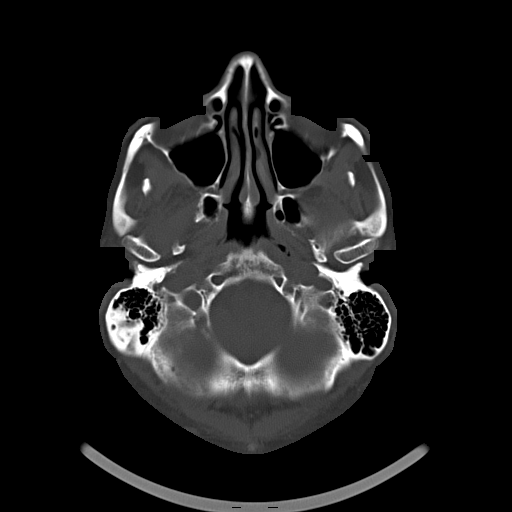
[im 7/32  bone]
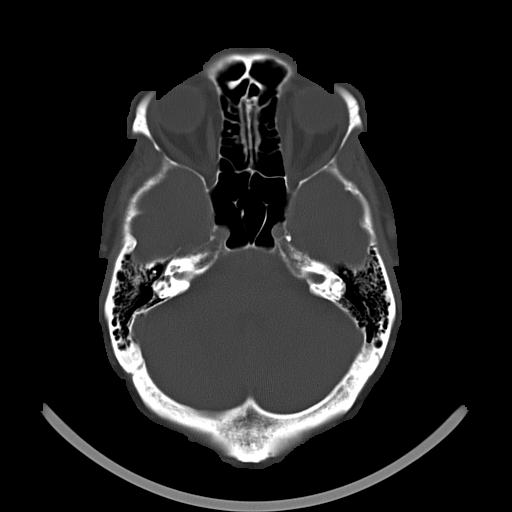
[im 12/32  bone]
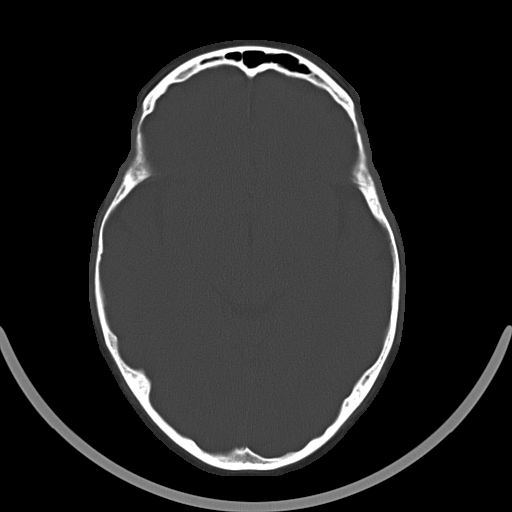

[16 of 30 positions shown; findings below may reference images not displayed]

FINDINGS: Subtle remote peripheral right cerebellar infarct, no change from
7355.

The brainstem, cerebral peduncle is, thalami, basal ganglia,
ventricular system, and basilar cisterns appear otherwise
unremarkable aside from likely physiologic calcification in the
globus pallidus nuclei.

No intracranial hemorrhage, mass lesion, or acute CVA.
IMPRESSION: 1. No acute intracranial findings.
2. Small remote right cerebellar infarct.
